# Patient Record
Sex: Female | Born: 1985 | Race: White | Hispanic: No | Marital: Single | Smoking: Current some day smoker
Health system: Southern US, Community
[De-identification: ages and names within clinical notes are randomized; demographics above are authoritative.]

## PROBLEM LIST (undated history)

## (undated) DIAGNOSIS — F32A Depression, unspecified: Secondary | ICD-10-CM

## (undated) DIAGNOSIS — A483 Toxic shock syndrome: Secondary | ICD-10-CM

## (undated) DIAGNOSIS — F329 Major depressive disorder, single episode, unspecified: Secondary | ICD-10-CM

## (undated) HISTORY — DX: Depression, unspecified: F32.A

## (undated) HISTORY — DX: Major depressive disorder, single episode, unspecified: F32.9

## (undated) HISTORY — DX: Toxic shock syndrome: A48.3

---
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
## 2005-04-01 ENCOUNTER — Emergency Department (HOSPITAL_COMMUNITY): Admission: EM | Admit: 2005-04-01 | Discharge: 2005-04-01 | Payer: Self-pay | Admitting: Emergency Medicine

## 2006-02-11 ENCOUNTER — Emergency Department (HOSPITAL_COMMUNITY): Admission: EM | Admit: 2006-02-11 | Discharge: 2006-02-12 | Payer: Self-pay | Admitting: Emergency Medicine

## 2008-06-17 ENCOUNTER — Emergency Department (HOSPITAL_COMMUNITY): Admission: EM | Admit: 2008-06-17 | Discharge: 2008-06-17 | Payer: Self-pay | Admitting: Family Medicine

## 2010-04-06 ENCOUNTER — Inpatient Hospital Stay (HOSPITAL_COMMUNITY): Admission: AD | Admit: 2010-04-06 | Discharge: 2010-04-06 | Payer: Self-pay | Admitting: Obstetrics and Gynecology

## 2010-07-19 ENCOUNTER — Inpatient Hospital Stay (HOSPITAL_COMMUNITY): Admission: AD | Admit: 2010-07-19 | Discharge: 2010-07-24 | Payer: Self-pay | Admitting: Obstetrics and Gynecology

## 2010-07-27 ENCOUNTER — Ambulatory Visit: Admission: RE | Admit: 2010-07-27 | Discharge: 2010-07-27 | Payer: Self-pay | Admitting: Obstetrics & Gynecology

## 2011-03-10 LAB — CBC
HCT: 29.5 % — ABNORMAL LOW (ref 36.0–46.0)
HCT: 33.8 % — ABNORMAL LOW (ref 36.0–46.0)
Hemoglobin: 10.3 g/dL — ABNORMAL LOW (ref 12.0–15.0)
MCHC: 34.8 g/dL (ref 30.0–36.0)
MCHC: 34.9 g/dL (ref 30.0–36.0)
MCV: 98.2 fL (ref 78.0–100.0)
Platelets: 165 10*3/uL (ref 150–400)
Platelets: 205 10*3/uL (ref 150–400)
RBC: 2.98 MIL/uL — ABNORMAL LOW (ref 3.87–5.11)
RBC: 3.45 MIL/uL — ABNORMAL LOW (ref 3.87–5.11)
RDW: 14.7 % (ref 11.5–15.5)
WBC: 12.1 10*3/uL — ABNORMAL HIGH (ref 4.0–10.5)

## 2011-03-14 LAB — URINALYSIS, ROUTINE W REFLEX MICROSCOPIC
Bilirubin Urine: NEGATIVE
Glucose, UA: NEGATIVE mg/dL
Hgb urine dipstick: NEGATIVE
Ketones, ur: NEGATIVE mg/dL
Nitrite: NEGATIVE
Protein, ur: NEGATIVE mg/dL
Urobilinogen, UA: 0.2 mg/dL (ref 0.0–1.0)

## 2011-03-14 LAB — COMPREHENSIVE METABOLIC PANEL
ALT: 19 U/L (ref 0–35)
AST: 19 U/L (ref 0–37)
Albumin: 3 g/dL — ABNORMAL LOW (ref 3.5–5.2)
Alkaline Phosphatase: 59 U/L (ref 39–117)
CO2: 20 mEq/L (ref 19–32)
Calcium: 8.6 mg/dL (ref 8.4–10.5)
Creatinine, Ser: 0.51 mg/dL (ref 0.4–1.2)
GFR calc non Af Amer: >60 mL/min (ref >60)
Glucose, Bld: 118 mg/dL — ABNORMAL HIGH (ref 70–99)
Sodium: 135 mEq/L (ref 135–145)
Total Bilirubin: 0.2 mg/dL — ABNORMAL LOW (ref 0.3–1.2)
Total Protein: 5.8 g/dL — ABNORMAL LOW (ref 6.0–8.3)

## 2011-05-25 LAB — HM PAP SMEAR: HM Pap smear: NORMAL

## 2011-09-20 LAB — CBC
Hemoglobin: 13.8
Platelets: 266
RBC: 4.23

## 2011-09-20 LAB — DIFFERENTIAL
Basophils Relative: 1
Eosinophils Relative: 1
Lymphocytes Relative: 25
Monocytes Absolute: 0.6
Monocytes Relative: 7
Neutrophils Relative %: 66

## 2011-09-20 LAB — COMPREHENSIVE METABOLIC PANEL
ALT: 17
AST: 22
BUN: 11
CO2: 25
Chloride: 106
GFR calc Af Amer: >60
GFR calc non Af Amer: >60
Glucose, Bld: 89
Sodium: 139
Total Bilirubin: 0.6

## 2012-01-08 ENCOUNTER — Ambulatory Visit (INDEPENDENT_AMBULATORY_CARE_PROVIDER_SITE_OTHER): Payer: Managed Care, Other (non HMO) | Admitting: Family

## 2012-01-08 ENCOUNTER — Encounter: Payer: Self-pay | Admitting: Family

## 2012-01-08 DIAGNOSIS — Z720 Tobacco use: Secondary | ICD-10-CM

## 2012-01-08 DIAGNOSIS — F418 Other specified anxiety disorders: Secondary | ICD-10-CM | POA: Insufficient documentation

## 2012-01-08 DIAGNOSIS — F909 Attention-deficit hyperactivity disorder, unspecified type: Secondary | ICD-10-CM

## 2012-01-08 DIAGNOSIS — M542 Cervicalgia: Secondary | ICD-10-CM | POA: Insufficient documentation

## 2012-01-08 DIAGNOSIS — F17200 Nicotine dependence, unspecified, uncomplicated: Secondary | ICD-10-CM

## 2012-01-08 DIAGNOSIS — F329 Major depressive disorder, single episode, unspecified: Secondary | ICD-10-CM

## 2012-01-08 MED ORDER — AMPHETAMINE-DEXTROAMPHET ER 15 MG PO CP24: 15.0000 mg | ORAL_CAPSULE | ORAL | Status: DC

## 2012-01-08 MED ORDER — METHYLPREDNISOLONE 4 MG PO KIT: PACK | ORAL | Status: AC

## 2012-01-08 MED ORDER — BUPROPION HCL ER (XL) 150 MG PO TB24: 150.0000 mg | ORAL_TABLET | Freq: Every day | ORAL | Status: DC

## 2012-01-08 MED ORDER — CYCLOBENZAPRINE HCL 5 MG PO TABS: ORAL_TABLET | ORAL | Status: DC

## 2012-01-08 NOTE — Assessment & Plan Note (Signed)
We discussed the importance of smoking cessation for her health.  Given the occasional nature of her cigarette use, I have recommended the nicorette gum in place of cigarettes.  She is very motivated to quit.  3 minutes spent counseling pt on smoking cessation today.

## 2012-01-08 NOTE — Progress Notes (Signed)
Subjective:    Patient ID: Joanne [name], female    DOB: Apr 19, 1986, 26 y.o.   MRN: [id number]  HPI  Joanne [name] is a 26 yr old female who presents today to establish care.  Previously she was following with a primary care in [place].  She is originally from [place].  Asthma- As a child- no longer an active issue per patient.   Depression- She reports that she has had depression since her early teens.  Reports some seasonal affective disorder.  Reports that depression runs in her family.  Had some post-partum depression following the birth of her daughter in 2011.  Has been treated with zoloft in the past which helped with some of her anxiety symptoms. Currently on Wellbutrin and reports that she feels good. She feels that she is less tearful and more level headed since starting Wellbutrin.  ADD- she reports that she was diagnosed in her teens. Reports that she had a formal psychological evaluation in her teens and again in college.   She has taken Strattera- made her feel "aggressive." She reports that ADD is well controlled on Adderall and she is requesting a refill.   Neck pain- This is her primary concern today.  She reports that she gets "Kinks" in her neck every few months.  Sunday night 1/6, she woke up with "kink" in neck.  Symptoms worsened.  She has been using ibuprofen 800 which helps some with the pain.  Pain is worst with laying down, better with activity.  Trouble sleeping due to severe pain.  She has associated numbness/pain in the left arm.    Tobacco abuse- She smokes 1 pack a week.  She tells me that she smokes "socially." Doesn't "crave" cigarettes due to the Wellbutrin.  Really wants to be healthier.    Review of Systems  Constitutional: Negative for unexpected weight change.  HENT: Positive for neck pain. Negative for congestion.   Eyes:       Wears contacts  Respiratory: Negative for cough.   Cardiovascular: Negative for chest pain.  Gastrointestinal: Negative for  nausea, vomiting and diarrhea.  Genitourinary: Negative for menstrual problem.  Musculoskeletal:       L shoulder pain  Neurological:       Occasional headaches which she attributes to neck pain.   Hematological: Negative for adenopathy.  Psychiatric/Behavioral:       See HPI       Objective:   Physical Exam  Constitutional: She is oriented to person, place, and time. She appears well-developed and well-nourished. No distress.  HENT:  Head: Normocephalic and atraumatic.  Right Ear: Tympanic membrane and ear canal normal.  Left Ear: Tympanic membrane and ear canal normal.  Mouth/Throat: No posterior oropharyngeal edema or posterior oropharyngeal erythema.  Neck: Neck supple. No thyromegaly present.  Cardiovascular: Normal rate and regular rhythm.   No murmur heard. Pulmonary/Chest: Effort normal and breath sounds normal. No respiratory distress. She has no wheezes. She has no rales. She exhibits no tenderness.  Lymphadenopathy:    She has no cervical adenopathy.  Neurological: She is alert and oriented to person, place, and time.       Very slight LUE weakness noted- but I suspect that it is due to lack of effort in setting of pain.  Hand grasps are strong and equal.   Skin: Skin is warm, dry and intact.  Psychiatric: She has a normal mood and affect. Her behavior is normal. Judgment and thought content normal. Cognition and memory  are normal.          Assessment & Plan:

## 2012-01-08 NOTE — Assessment & Plan Note (Signed)
Clinically stable on Wellbutrin.  Continue same.

## 2012-01-08 NOTE — Patient Instructions (Signed)
Please arrange a fasting physical. Welcome to [facility]!

## 2012-01-08 NOTE — Assessment & Plan Note (Addendum)
New. Trial of Medrol Dose Pak + flexeril HS PRN.  If no improvement with these measures consider MRI of the C spine.

## 2012-01-08 NOTE — Assessment & Plan Note (Addendum)
Stable. Reviewed [place] Controlled substance reg and see that she had 1 prescription filled in August of this year [place] for the stated dose. A controlled substance contract was signed today.

## 2012-01-24 ENCOUNTER — Telehealth: Payer: Self-pay | Admitting: *Deleted

## 2012-01-24 NOTE — Telephone Encounter (Signed)
Records received and forwarded to Provider for review. 

## 2012-02-12 ENCOUNTER — Encounter: Payer: Managed Care, Other (non HMO) | Admitting: Family

## 2012-02-18 ENCOUNTER — Telehealth: Payer: Self-pay | Admitting: *Deleted

## 2012-02-18 NOTE — Telephone Encounter (Signed)
Received message from pt stating she wants to taper off of Wellbutrin and is not sure how to proceed. Currently takes 150mg  daily. Please advise.

## 2012-02-19 MED ORDER — BUPROPION HCL 75 MG PO TABS: ORAL_TABLET | ORAL | Status: DC

## 2012-02-19 NOTE — Telephone Encounter (Signed)
Notified pt and scheduled f/u for 04/01/12 at 8:15am.

## 2012-02-19 NOTE — Telephone Encounter (Signed)
See instructions below for Wellbutrin 75mg  which has been sent to her pharmacy.  She should call if she develops recurrent depression with taper.  I would like to see her back in the office in 6 weeks please.

## 2012-04-01 ENCOUNTER — Ambulatory Visit: Payer: Managed Care, Other (non HMO) | Admitting: Family

## 2012-04-08 ENCOUNTER — Ambulatory Visit: Payer: Managed Care, Other (non HMO) | Admitting: Family

## 2012-05-16 ENCOUNTER — Telehealth: Payer: Self-pay | Admitting: *Deleted

## 2012-05-16 NOTE — Telephone Encounter (Signed)
Intermittent spotting can be normal with mirena, but I do think she should complete home hcg test.

## 2012-05-16 NOTE — Telephone Encounter (Signed)
Received message from pt that she has had IUD (mirena) x 1 1/2 yrs. Has had spotting x 2 weeks with nausea. States she is going to do a home HCG test but wants to know if this is normal?  Please advise.

## 2012-05-16 NOTE — Telephone Encounter (Signed)
Left detailed message on pt's voicemail and to call Tuesday if further concerns.

## 2012-07-28 ENCOUNTER — Ambulatory Visit (INDEPENDENT_AMBULATORY_CARE_PROVIDER_SITE_OTHER): Payer: Managed Care, Other (non HMO) | Admitting: Family

## 2012-07-28 ENCOUNTER — Encounter: Payer: Self-pay | Admitting: Family

## 2012-07-28 VITALS — BP 100/70 | HR 84 | Temp 97.9°F | Resp 16 | Ht 65.5 in | Wt 205.0 lb

## 2012-07-28 DIAGNOSIS — J069 Acute upper respiratory infection, unspecified: Secondary | ICD-10-CM

## 2012-07-28 DIAGNOSIS — F909 Attention-deficit hyperactivity disorder, unspecified type: Secondary | ICD-10-CM

## 2012-07-28 MED ORDER — AMPHETAMINE-DEXTROAMPHETAMINE 10 MG PO TABS: 10.0000 mg | ORAL_TABLET | Freq: Every day | ORAL | Status: DC

## 2012-07-28 MED ORDER — DOXYCYCLINE HYCLATE 100 MG PO CAPS: 100.0000 mg | ORAL_CAPSULE | Freq: Two times a day (BID) | ORAL | Status: AC

## 2012-07-28 MED ORDER — FLUTICASONE PROPIONATE 50 MCG/ACT NA SUSP: 2.0000 | Freq: Every day | NASAL | Status: DC

## 2012-07-28 NOTE — Assessment & Plan Note (Signed)
Symptoms most consistent with URI at this point.  Add flonase, zyrtec D. Rx provided to pt for doxycycline if her symptoms worsen, or if no improvement in 3 days with these recs.

## 2012-07-28 NOTE — Assessment & Plan Note (Signed)
Deteriorated.  She has been intolerant to strattera in the past.  Will add regular release adderall in the AM to see if this will hold her through the majority of her work day without causing insomnia.  Will re-evaluate anxiety at her upcoming visit.  Consider adding zoloft at that time if no improvement. 25 minutes spent with pt today. >50% of this time was spent counseling pt on her ADD, anxiety, depression.

## 2012-07-28 NOTE — Progress Notes (Signed)
Subjective:    Patient ID: Joanne [name], female    DOB: 1986/06/15, 26 y.o.   MRN: [id number]  HPI  [name] is a 26 yr old female who presents today to discuss sinus congestion and for follow up.  1) Sinus congestion- Reports that Friday 8/2, she developed facial pain, body aches, nausea.  She has been using an otc allergy prep without improvement.  No fever.   2) Adderall- Went off of adderall and wellbutrin in February.  Worried about long term side effects of the medication.  Job performance has dropped. Hasn't met any of her performance markers since she stopped the adderall. She has upped b12 vitamins.  Exercising more. Felt jittery on adderal, insomnia, HA.  Depression- she reports that she does not feel depressed. Thinks this is more of a seasonal issue.    Anxiety- reports impulsive thoughts.  Review of Systems See HPI  Past Medical History  Diagnosis Date  . Asthma     childhood exercise induced  . Depression   . 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 Marital Status: Single    Spouse Name: N/A    Number of Children: 2  . Years of Education: N/A   Occupational History  . Not on file.   Social History Main Topics  . Smoking status: Current Everyday Smoker    Types: Cigarettes  . Smokeless tobacco: Never Used   Comment: <1 pack cigarettes weekly.  . Alcohol Use: 0.5 - 2.0 oz/week    1-4 drink(s) per week  . Drug Use: No  . Sexually Active: Not on file   Other Topics Concern  . Not on file   Social History Narrative   Caffeine use: 1 cup coffee dailyRegular exercise: no Daughter born in 2011.Works at [employer] with daughter and her boyfriend- they are in process of separating.     Past Surgical History  Procedure Date  . Cesarean section 2011  . Tonsillectomy 2004    Family History  Problem Relation Age of Onset  . Hypertension Father   . Bipolar disorder Maternal Uncle   .  Arthritis Maternal Grandmother   . Cancer Maternal Grandmother     ovarian  . Depression Maternal Grandmother   . Bipolar disorder Maternal Grandmother   . Arthritis Maternal Grandfather   . Heart disease Maternal Grandfather   . Stroke Maternal Grandfather   . Hypertension Maternal Grandfather   . Arthritis Paternal Grandmother   . Cancer Paternal Grandmother   . Arthritis Paternal Grandfather   . Stroke Paternal Grandfather   . Hypertension Paternal Grandfather   . Cancer Other     breast    Allergies  Allergen Reactions  . Amoxicillin Hives  . Penicillins Hives  . Thera-Gesic Rash    Current Outpatient Prescriptions on File Prior to Visit  Medication Sig Dispense Refill  . cyclobenzaprine (FLEXERIL) 5 MG tablet One tablet at bedtime as needed for neck pain  15 tablet  0  . levonorgestrel (MIRENA) 20 MCG/24HR IUD 1 each by Intrauterine route once.      Marland Kitchen buPROPion (WELLBUTRIN) 75 MG tablet One half tablet twice daily for 2 weeks, then every other day for 2 weeks then stop.  21 tablet  0  . fluticasone (FLONASE) 50 MCG/ACT nasal spray Place 2 sprays into the nose daily.  16 g  0  . DISCONTD: amphetamine-dextroamphetamine (ADDERALL XR) 15 MG  24 hr capsule Take 1 capsule (15 mg total) by mouth every morning.  30 capsule  0    BP 100/70  Pulse 84  Temp 97.9 F (36.6 C) (Oral)  Resp 16  Ht 5' 5.5" (1.664 m)  Wt 205 lb 0.6 oz (93.006 kg)  BMI 33.60 kg/m2  SpO2 99%       Objective:   Physical Exam  Constitutional: She appears well-developed and well-nourished. No distress.  HENT:  Head: Normocephalic and atraumatic.  Right Ear: Tympanic membrane and ear canal normal.  Left Ear: Tympanic membrane and ear canal normal.       No maxillary or frontal sinus tenderness to palpation.  Cardiovascular: Normal rate and regular rhythm.   No murmur heard. Pulmonary/Chest: Effort normal and breath sounds normal. No respiratory distress. She has no wheezes. She has no rales. She  exhibits no tenderness.  Psychiatric: She has a normal mood and affect. Her behavior is normal. Judgment and thought content normal.          Assessment & Plan:

## 2012-07-28 NOTE — Patient Instructions (Addendum)
Please start flonase and zytec D. If sinus symptoms worsen, if you develop fever or if not improved in 3 days- then start doxycyline. Follow up in 6 weeks.

## 2012-09-02 ENCOUNTER — Telehealth: Payer: Self-pay | Admitting: Family

## 2012-09-02 MED ORDER — AMPHETAMINE-DEXTROAMPHETAMINE 10 MG PO TABS: 10.0000 mg | ORAL_TABLET | Freq: Every day | ORAL | Status: DC

## 2012-09-02 NOTE — Telephone Encounter (Signed)
Patient is requesting a new prescription of adderall °

## 2012-09-02 NOTE — Telephone Encounter (Signed)
Adderall rx last printed on 07/28/12. Pt has follow up on 09/08/12. Rx printed and forwarded to Provider for signature.

## 2012-09-02 NOTE — Telephone Encounter (Signed)
Notified pt. 

## 2012-09-02 NOTE — Telephone Encounter (Signed)
Signed.

## 2012-09-08 ENCOUNTER — Ambulatory Visit: Payer: Managed Care, Other (non HMO) | Admitting: Family

## 2012-09-10 ENCOUNTER — Encounter: Payer: Self-pay | Admitting: Family

## 2012-09-10 ENCOUNTER — Ambulatory Visit (INDEPENDENT_AMBULATORY_CARE_PROVIDER_SITE_OTHER): Payer: Managed Care, Other (non HMO) | Admitting: Family

## 2012-09-10 VITALS — BP 134/86 | HR 92 | Temp 97.8°F | Resp 16 | Wt 203.1 lb

## 2012-09-10 DIAGNOSIS — F329 Major depressive disorder, single episode, unspecified: Secondary | ICD-10-CM

## 2012-09-10 DIAGNOSIS — F909 Attention-deficit hyperactivity disorder, unspecified type: Secondary | ICD-10-CM

## 2012-09-10 MED ORDER — AMPHETAMINE-DEXTROAMPHETAMINE 10 MG PO TABS: 10.0000 mg | ORAL_TABLET | Freq: Every day | ORAL | Status: DC

## 2012-09-10 NOTE — Patient Instructions (Addendum)
Please schedule a follow up appointment in 3 months.

## 2012-09-10 NOTE — Assessment & Plan Note (Signed)
Stable.  Continue Adderall at current dose.

## 2012-09-10 NOTE — Progress Notes (Signed)
  Subjective:    Patient ID: Joanne [name], female    DOB: 01/21/1986, 26 y.o.   MRN: [id number]  HPI  [name] presents today for follow up.   ADHD- Pt reports that she is feeling great on current adderall regimen.  No longer feeling jittery, able to sleep at night.  Notes concentration is very good during the day. Concentration wanes around 3:30-4pm. She is ok with that.   Anxiety-  Mild at this point.  Denies depression.   Review of Systems See HPI  Past Medical History  Diagnosis Date  . Asthma     childhood exercise induced  . Depression   . 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 Marital Status: Single    Spouse Name: N/A    Number of Children: 2  . Years of Education: N/A   Occupational History  . Not on file.   Social History Main Topics  . Smoking status: Current Every Day Smoker    Types: Cigarettes  . Smokeless tobacco: Never Used   Comment: <1 pack cigarettes weekly.  . Alcohol Use: 0.5 - 2.0 oz/week    1-4 drink(s) per week  . Drug Use: No  . Sexually Active: Not on file   Other Topics Concern  . Not on file   Social History Narrative   Caffeine use: 1 cup coffee dailyRegular exercise: no Daughter born in 2011.Works at [employer] with daughter and her boyfriend- they are in process of separating.     Past Surgical History  Procedure Date  . Cesarean section 2011  . Tonsillectomy 2004    Family History  Problem Relation Age of Onset  . Hypertension Father   . Bipolar disorder Maternal Uncle   . Arthritis Maternal Grandmother   . Cancer Maternal Grandmother     ovarian  . Depression Maternal Grandmother   . Bipolar disorder Maternal Grandmother   . Arthritis Maternal Grandfather   . Heart disease Maternal Grandfather   . Stroke Maternal Grandfather   . Hypertension Maternal Grandfather   . Arthritis Paternal Grandmother   . Cancer Paternal Grandmother   . Arthritis  Paternal Grandfather   . Stroke Paternal Grandfather   . Hypertension Paternal Grandfather   . Cancer Other     breast    Allergies  Allergen Reactions  . Amoxicillin Hives  . Penicillins Hives  . Thera-Gesic Rash    Current Outpatient Prescriptions on File Prior to Visit  Medication Sig Dispense Refill  . DISCONTD: amphetamine-dextroamphetamine (ADDERALL) 10 MG tablet Take 1 tablet (10 mg total) by mouth daily.  30 tablet  0  . levonorgestrel (MIRENA) 20 MCG/24HR IUD 1 each by Intrauterine route once.        BP 134/86  Pulse 92  Temp 97.8 F (36.6 C) (Oral)  Resp 16  Wt 203 lb 1.3 oz (92.116 kg)  SpO2 99%       Objective:   Physical Exam  Constitutional: She appears well-developed and well-nourished. No distress.  Pulmonary/Chest: Effort normal and breath sounds normal.  Musculoskeletal: She exhibits edema.  Psychiatric: She has a normal mood and affect. Her behavior is normal. Judgment and thought content normal.          Assessment & Plan:

## 2012-09-10 NOTE — Assessment & Plan Note (Signed)
Depression/anxiety are stable. She is off of the Wellbutrin.

## 2012-09-15 ENCOUNTER — Emergency Department (HOSPITAL_COMMUNITY)
Admission: EM | Admit: 2012-09-15 | Discharge: 2012-09-16 | Disposition: A | Discharge status: Home / Self Care | Payer: Managed Care, Other (non HMO)

## 2012-09-15 NOTE — ED Notes (Signed)
Pt. Presented to check-in window stating that she was going to leave before being seen in triage.

## 2012-09-16 ENCOUNTER — Telehealth: Payer: Self-pay | Admitting: *Deleted

## 2012-09-16 ENCOUNTER — Encounter: Payer: Self-pay | Admitting: Family

## 2012-09-16 ENCOUNTER — Ambulatory Visit (INDEPENDENT_AMBULATORY_CARE_PROVIDER_SITE_OTHER): Payer: Managed Care, Other (non HMO) | Admitting: Family

## 2012-09-16 VITALS — BP 116/64 | HR 85 | Temp 98.5°F | Resp 16 | Wt 204.0 lb

## 2012-09-16 DIAGNOSIS — M542 Cervicalgia: Secondary | ICD-10-CM

## 2012-09-16 DIAGNOSIS — K625 Hemorrhage of anus and rectum: Secondary | ICD-10-CM

## 2012-09-16 DIAGNOSIS — R195 Other fecal abnormalities: Secondary | ICD-10-CM

## 2012-09-16 LAB — CBC WITH DIFFERENTIAL/PLATELET
Basophils Relative: 0 % (ref 0–1)
Eosinophils Absolute: 0.2 10*3/uL (ref 0.0–0.7)
Hemoglobin: 14.1 g/dL (ref 12.0–15.0)
Lymphs Abs: 2.4 10*3/uL (ref 0.7–4.0)
MCH: 32.3 pg (ref 26.0–34.0)
MCV: 95.7 fL (ref 78.0–100.0)
Monocytes Absolute: 0.4 10*3/uL (ref 0.1–1.0)
Platelets: 280 10*3/uL (ref 150–400)

## 2012-09-16 MED ORDER — CYCLOBENZAPRINE HCL 5 MG PO TABS: 5.0000 mg | ORAL_TABLET | Freq: Three times a day (TID) | ORAL | Status: DC | PRN

## 2012-09-16 NOTE — Patient Instructions (Addendum)
Call if you experience red colored stools after today.

## 2012-09-16 NOTE — Assessment & Plan Note (Signed)
Recommended trial of ibuprofen and prn flexeril- pt was reminded not to drive after taking flexeril due to drowsiness.

## 2012-09-16 NOTE — Assessment & Plan Note (Signed)
Pt is heme negative, rectal and abdominal exam wnl.  I suspect that red stool was result of food dye from red velvet cake earlier that day. Will obtain cbc.  Pt is advised to call us if she sees any further red stools.  Pt verbalizes understanding.

## 2012-09-16 NOTE — Progress Notes (Signed)
Subjective:    Patient ID: Joanne [name], female    DOB: 11/21/1986, 26 y.o.   MRN: [id number]  HPI  Ms.  Joanne [name] is a 26 yr old female who presents today with chief complain of red stool.  Pt reports episode on Monday evening of bright red stool and blood in the toilet. Reports feeling fatigued, slightly dizzy and has noted abdominal cramping since the weekend. No BM today. Pt ate red velvet cake yesterday AM.  Reports "pinched nerve" in the right shoulder.  She has not used ibuprofen.  Reports that she recently moved and was doing some lifting.  Has flare ups about 3 times a year.    Review of Systems    see HPI  Past Medical History  Diagnosis Date  . Asthma     childhood exercise induced  . Depression   . 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 Marital Status: Single    Spouse Name: N/A    Number of Children: 2  . Years of Education: N/A   Occupational History  . Not on file.   Social History Main Topics  . Smoking status: Current Every Day Smoker    Types: Cigarettes  . Smokeless tobacco: Never Used   Comment: <1 pack cigarettes weekly.  . Alcohol Use: 0.5 - 2.0 oz/week    1-4 drink(s) per week  . Drug Use: No  . Sexually Active: Not on file   Other Topics Concern  . Not on file   Social History Narrative   Caffeine use: 1 cup coffee dailyRegular exercise: no Daughter born in 2011.Works at [employer] with daughter and her boyfriend- they are in process of separating.     Past Surgical History  Procedure Date  . Cesarean section 2011  . Tonsillectomy 2004    Family History  Problem Relation Age of Onset  . Hypertension Father   . Bipolar disorder Maternal Uncle   . Arthritis Maternal Grandmother   . Cancer Maternal Grandmother     ovarian  . Depression Maternal Grandmother   . Bipolar disorder Maternal Grandmother   . Arthritis Maternal Grandfather   . Heart disease Maternal  Grandfather   . Stroke Maternal Grandfather   . Hypertension Maternal Grandfather   . Arthritis Paternal Grandmother   . Cancer Paternal Grandmother   . Arthritis Paternal Grandfather   . Stroke Paternal Grandfather   . Hypertension Paternal Grandfather   . Cancer Other     breast    Allergies  Allergen Reactions  . Amoxicillin Hives  . Penicillins Hives  . Thera-Gesic Rash    Current Outpatient Prescriptions on File Prior to Visit  Medication Sig Dispense Refill  . amphetamine-dextroamphetamine (ADDERALL) 10 MG tablet Take 1 tablet (10 mg total) by mouth daily.  30 tablet  0  . levonorgestrel (MIRENA) 20 MCG/24HR IUD 1 each by Intrauterine route once.        BP 116/64  Pulse 85  Temp 98.5 F (36.9 C) (Oral)  Resp 16  Wt 204 lb (92.534 kg)  SpO2 99%    Objective:   Physical Exam  Constitutional: She is oriented to person, place, and time. She appears well-developed and well-nourished. No distress.  Cardiovascular: Normal rate and regular rhythm.   No murmur heard. Pulmonary/Chest: Effort normal and breath sounds normal. No respiratory distress. She has no wheezes. She has no rales. She exhibits no tenderness.  Abdominal: Soft. Bowel sounds are normal.  Genitourinary: Guaiac negative stool.       Rectal exam normal.  Neurological: She is alert and oriented to person, place, and time.  Skin: Skin is warm and dry.  Psychiatric: She has a normal mood and affect. Her behavior is normal. Judgment and thought content normal.          Assessment & Plan:

## 2012-09-16 NOTE — Telephone Encounter (Signed)
Pt has appt with Sandford Craze today at 9:30. Attempted to reach pt to have her come in earlier per Provider recommendation and left detailed message on voicemail.  [facility] Triage Call Report Triage Record Num: [id number] Operator: Tarri Glenn Patient Name: Joanne [name] Call Date & Time: 09/15/2012 6:13:34PM Patient Phone: [phone] PCP: Sandford Craze, NP Patient Gender: Female PCP Fax : [phone] Patient DOB: [birth year] Practice Name: [facility] Reason for Call: Caller: Bobbijo/Patient; PCP: Peggyann Juba, Efraim Kaufmann (Adults only); CB#: [phone]; Patient is calling about patient had bowel movement 09/15/12 and stool was extremely bloody even turning toilet water red. Afebrile. All emergent signs and symptoms ruled out with exception to one or more episodes of rectal bleeding and no symptoms of hypovolemia per Gastrointestinal Bleeding protocol. Advised patient to go to emergency room per see provider within 4 hours disposition. Patient will go to [facility]. Protocol(s) Used: Gastrointestinal Bleeding Recommended Outcome per Protocol: See Provider within 4 hours Reason for Outcome: One or more episodes of rectal bleeding (more than scant) and no symptoms of hypovolemia Care Advice: May have clear liquids (such as water, clear fruit juices without pulp, soda, tea or coffee without dairy or non-dairy creamer, clear broth or bouillon, oral hydration solution, or plain gelatin, fruit ices/popsicles, hard candy) but do not eat solid foods before being seen by your provider.

## 2012-09-17 ENCOUNTER — Encounter: Payer: Self-pay | Admitting: Family

## 2012-11-04 ENCOUNTER — Other Ambulatory Visit: Payer: Self-pay | Admitting: *Deleted

## 2012-11-04 MED ORDER — AMPHETAMINE-DEXTROAMPHETAMINE 10 MG PO TABS: 10.0000 mg | ORAL_TABLET | Freq: Every day | ORAL | Status: DC

## 2012-11-04 NOTE — Telephone Encounter (Signed)
Signed.

## 2012-11-04 NOTE — Telephone Encounter (Signed)
Rx placed at front desk and pt notified. 

## 2012-11-04 NOTE — Telephone Encounter (Signed)
Pt left message requesting refill of Adderall. Rx last printed on 09/16/12 #30 x no refills. Rx printed and forwarded to Provider for signature.

## 2012-12-08 ENCOUNTER — Ambulatory Visit: Payer: Managed Care, Other (non HMO) | Admitting: Family

## 2012-12-11 ENCOUNTER — Telehealth: Payer: Self-pay | Admitting: *Deleted

## 2012-12-11 MED ORDER — AMPHETAMINE-DEXTROAMPHETAMINE 10 MG PO TABS: 10.0000 mg | ORAL_TABLET | Freq: Every day | ORAL | Status: DC

## 2012-12-11 NOTE — Telephone Encounter (Signed)
Received message from pt requesting refill of Adderall 10mg . Rx last printed on 11/04/12. Rx printed for #30 x no refills and forwarded to Provider for signature. Also, pt cancelled appointment on 12/08/12.

## 2012-12-12 NOTE — Telephone Encounter (Signed)
Message left informing pt that Rx is ready for pick up. Note attached to rx to schedule f/u at the end of March.

## 2012-12-12 NOTE — Telephone Encounter (Signed)
Signed. I would like to see her back in the end of march please.

## 2013-01-06 ENCOUNTER — Encounter: Payer: Self-pay | Admitting: Family

## 2013-01-06 ENCOUNTER — Ambulatory Visit (INDEPENDENT_AMBULATORY_CARE_PROVIDER_SITE_OTHER): Payer: Managed Care, Other (non HMO) | Admitting: Family

## 2013-01-06 VITALS — BP 110/70 | HR 98 | Temp 98.7°F | Resp 16 | Wt 208.0 lb

## 2013-01-06 DIAGNOSIS — J988 Other specified respiratory disorders: Secondary | ICD-10-CM | POA: Insufficient documentation

## 2013-01-06 DIAGNOSIS — B9789 Other viral agents as the cause of diseases classified elsewhere: Secondary | ICD-10-CM | POA: Insufficient documentation

## 2013-01-06 MED ORDER — OSELTAMIVIR PHOSPHATE 75 MG PO CAPS: 75.0000 mg | ORAL_CAPSULE | Freq: Two times a day (BID) | ORAL | Status: DC

## 2013-01-06 NOTE — Addendum Note (Signed)
Addended by: Mervin Kung A on: 01/06/2013 02:37 PM   Modules accepted: Orders

## 2013-01-06 NOTE — Assessment & Plan Note (Signed)
Rapid flu is negative.  Daughter has confirmed flu.  Will go ahead and treat with tamiflu.  Recommended rest, fluids, motrin prn, call if symptoms worsen or do not resolve in 1 week.

## 2013-01-06 NOTE — Patient Instructions (Addendum)
Call if symptoms worsen or if not improved in 1 week.

## 2013-01-06 NOTE — Progress Notes (Signed)
Subjective:    Patient ID: Joanne [name], female    DOB: 02/23/1986, 27 y.o.   MRN: [id number]  HPI  Joanne [name] is a 27 yr old female who presents today with chief complaint of cough. She reports associated body aches, myalgia, chills, nasal congestion, sore throat.    Joanne [name] was diagnosed with flu.  Symptoms started yesterday.  Worsened this AM.   Review of Systems See HPI Past Medical History  Diagnosis Date  . Asthma     childhood exercise induced  . Depression   . Toxic shock syndrome (TSS)     27 yrs old    History   Social History  . Marital Status: Single    Spouse Name: N/A    Number of Children: 2  . Years of Education: N/A   Occupational History  . Not on file.   Social History Main Topics  . Smoking status: Current Every Day Smoker    Types: Cigarettes  . Smokeless tobacco: Never Used     Comment: <1 pack cigarettes weekly.  . Alcohol Use: 0.5 - 2.0 oz/week    1-4 drink(s) per week  . Drug Use: No  . Sexually Active: Not on file   Other Topics Concern  . Not on file   Social History Narrative   Caffeine use: 1 cup coffee dailyRegular exercise: no Joanne [name] born in 2011.Works at [employer] with Joanne [name] and her boyfriend- they are in process of separating.     Past Surgical History  Procedure Date  . Cesarean section 2011  . Tonsillectomy 2004    Family History  Problem Relation Age of Onset  . Hypertension Father   . Bipolar disorder Maternal Uncle   . Arthritis Maternal Grandmother   . Cancer Maternal Grandmother     ovarian  . Depression Maternal Grandmother   . Bipolar disorder Maternal Grandmother   . Arthritis Maternal Grandfather   . Heart disease Maternal Grandfather   . Stroke Maternal Grandfather   . Hypertension Maternal Grandfather   . Arthritis Paternal Grandmother   . Cancer Paternal Grandmother   . Arthritis Paternal Grandfather   . Stroke Paternal Grandfather   . Hypertension Paternal  Grandfather   . Cancer Other     breast    Allergies  Allergen Reactions  . Amoxicillin Hives  . Penicillins Hives  . Thera-Gesic Rash    Current Outpatient Prescriptions on File Prior to Visit  Medication Sig Dispense Refill  . amphetamine-dextroamphetamine (ADDERALL) 10 MG tablet Take 1 tablet (10 mg total) by mouth daily.  30 tablet  0  . levonorgestrel (MIRENA) 20 MCG/24HR IUD 1 each by Intrauterine route once.      . cyclobenzaprine (FLEXERIL) 5 MG tablet Take 1 tablet (5 mg total) by mouth 3 (three) times daily as needed for muscle spasms.  20 tablet  0    BP 110/70  Pulse 98  Temp 98.7 F (37.1 C) (Oral)  Resp 16  Wt 208 lb 0.6 oz (94.366 kg)  SpO2 99%       Objective:   Physical Exam  Constitutional: She is oriented to person, place, and time. She appears well-developed and well-nourished. No distress.       Tired appearing female.   HENT:  Head: Normocephalic and atraumatic.  Right Ear: Tympanic membrane and ear canal normal.  Left Ear: Tympanic membrane and ear canal normal.  Mouth/Throat: No posterior oropharyngeal edema or posterior oropharyngeal erythema.  Cardiovascular: Normal rate and regular rhythm.  No murmur heard. Pulmonary/Chest: Effort normal and breath sounds normal. No respiratory distress. She has no wheezes. She has no rales. She exhibits no tenderness.  Musculoskeletal: She exhibits no edema.  Lymphadenopathy:    She has no cervical adenopathy.  Neurological: She is alert and oriented to person, place, and time.  Skin: Skin is warm and dry.  Psychiatric: She has a normal mood and affect. Her behavior is normal. Judgment and thought content normal.          Assessment & Plan:

## 2013-01-13 ENCOUNTER — Telehealth: Payer: Self-pay | Admitting: *Deleted

## 2013-01-13 MED ORDER — AMPHETAMINE-DEXTROAMPHETAMINE 10 MG PO TABS: 10.0000 mg | ORAL_TABLET | Freq: Every day | ORAL | Status: DC

## 2013-01-13 NOTE — Telephone Encounter (Signed)
Pt called to f/u with you RE: Flu; pt reports that she feels "about 80% better, still has fatigue & a little cough".  Pt request refill for Adderall 10 mg [last Rx 12.19.13 #30x0]/SLS Please advise.

## 2013-01-13 NOTE — Telephone Encounter (Signed)
OK to refill adderall- printed.  Viral illness can last up to 10 days. I would expect her to be nearly 100% by the 24th.  Call me if symptoms do not continue to improve.

## 2013-01-13 NOTE — Telephone Encounter (Signed)
LMOM with contact name & number to inform pt of provider instructions & that requested Rx is ready for p/u Mon-Fri 8a-5p/SLS

## 2013-01-28 ENCOUNTER — Telehealth: Payer: Self-pay | Admitting: Family

## 2013-01-28 MED ORDER — AMPHETAMINE-DEXTROAMPHET ER 20 MG PO CP24: 20.0000 mg | ORAL_CAPSULE | ORAL | Status: DC

## 2013-01-28 NOTE — Telephone Encounter (Signed)
Please advise 

## 2013-01-28 NOTE — Telephone Encounter (Signed)
Left detailed message on cell voicemail and to call if any questions. Rx placed at front desk for pick up.

## 2013-01-28 NOTE — Telephone Encounter (Signed)
Patient left message on nurse's voicemail stating that she feels like she does better when taking adderall at breakfast and at lunch instead of taking one daily. She would like to know if Melissa could write her a new Rx for this?

## 2013-01-28 NOTE — Telephone Encounter (Signed)
I would recommend trial of Adderall extended release 20mg  once daily. Rx available for pick up.  Follow up in 1 month.

## 2013-02-18 ENCOUNTER — Encounter: Payer: Self-pay | Admitting: Family

## 2013-02-18 ENCOUNTER — Ambulatory Visit (HOSPITAL_BASED_OUTPATIENT_CLINIC_OR_DEPARTMENT_OTHER)
Admission: RE | Admit: 2013-02-18 | Discharge: 2013-02-18 | Disposition: A | Discharge status: Home / Self Care | Payer: Managed Care, Other (non HMO) | Source: Ambulatory Visit | Attending: Family | Admitting: Family

## 2013-02-18 ENCOUNTER — Telehealth: Payer: Self-pay | Admitting: Family

## 2013-02-18 ENCOUNTER — Ambulatory Visit (INDEPENDENT_AMBULATORY_CARE_PROVIDER_SITE_OTHER): Payer: Managed Care, Other (non HMO) | Admitting: Family

## 2013-02-18 ENCOUNTER — Other Ambulatory Visit: Payer: Self-pay | Admitting: Family

## 2013-02-18 ENCOUNTER — Other Ambulatory Visit (HOSPITAL_BASED_OUTPATIENT_CLINIC_OR_DEPARTMENT_OTHER): Payer: Managed Care, Other (non HMO)

## 2013-02-18 VITALS — BP 110/62 | HR 78 | Temp 98.0°F | Resp 16 | Wt 204.1 lb

## 2013-02-18 DIAGNOSIS — N949 Unspecified condition associated with female genital organs and menstrual cycle: Secondary | ICD-10-CM | POA: Insufficient documentation

## 2013-02-18 DIAGNOSIS — Z0189 Encounter for other specified special examinations: Secondary | ICD-10-CM

## 2013-02-18 DIAGNOSIS — N390 Urinary tract infection, site not specified: Secondary | ICD-10-CM

## 2013-02-18 DIAGNOSIS — R1032 Left lower quadrant pain: Secondary | ICD-10-CM

## 2013-02-18 LAB — POCT URINALYSIS DIPSTICK
Blood, UA: NEGATIVE
Glucose, UA: NEGATIVE
Ketones, UA: NEGATIVE
Spec Grav, UA: 1.02
Urobilinogen, UA: 0.2

## 2013-02-18 MED ORDER — CIPROFLOXACIN HCL 500 MG PO TABS: 500.0000 mg | ORAL_TABLET | Freq: Two times a day (BID) | ORAL | Status: DC

## 2013-02-18 NOTE — Assessment & Plan Note (Signed)
Urine notes + leuks.  Will rx empirically with cipro and send urine for culture.

## 2013-02-18 NOTE — Assessment & Plan Note (Signed)
Suspect cyst, will obtain pelvic ultrasound today.

## 2013-02-18 NOTE — Patient Instructions (Signed)
Please complete your pelvic ultrasound on the first floor.   Start cipro. Go to ER if abdominal pain worsens.

## 2013-02-18 NOTE — Assessment & Plan Note (Addendum)
Suspect ovarian cyst.  Will obtain pelvic ultrasound today to further evaluate.  She is instructed to go to the ER if pain worsens.  Urine HCG is negative.

## 2013-02-18 NOTE — [facility] Notes (Signed)
Subjective:    Patient ID: Joanne [name], female    DOB: 28-Feb-1986, 27 y.o.   MRN: [id number]  HPI  [name] is a 27 yr old female who presents today with chief complaint of abdominal pain.  Reports pain woke her up in the middle of the night.   Pain continues. She has a [employer] fast streamer.  She reports that about 2 weeks ago she had some spotting.   Review of Systems See HPI  Past Medical History  Diagnosis Date  . Asthma     childhood exercise induced  . Depression   . Toxic shock syndrome (TSS)     27 yrs old    History   Social History  . Marital Status: Single    Spouse Name: N/A    Number of Children: 2  . Years of Education: N/A   Occupational History  . Not on file.   Social History Main Topics  . Smoking status: Current Every Day Smoker    Types: Cigarettes  . Smokeless tobacco: Never Used     Comment: <1 pack cigarettes weekly.  . Alcohol Use: .5 - 2 oz/week    1-4 drink(s) per week  . Drug Use: No  . Sexually Active: Not on file   Other Topics Concern  . Not on file   Social History Narrative   Caffeine use: 1 cup coffee daily   Regular exercise: no    Daughter born in 2011.   Works at [employer] degree   Lives with daughter and her boyfriend- they are in process of separating.              Past Surgical History  Procedure Laterality Date  . Cesarean section  2011  . Tonsillectomy  2004    Family History  Problem Relation Age of Onset  . Hypertension Father   . Bipolar disorder Maternal Uncle   . Arthritis Maternal Grandmother   . Cancer Maternal Grandmother     ovarian  . Depression Maternal Grandmother   . Bipolar disorder Maternal Grandmother   . Arthritis Maternal Grandfather   . Heart disease Maternal Grandfather   . Stroke Maternal Grandfather   . Hypertension Maternal Grandfather   . Arthritis Paternal Grandmother   . Cancer Paternal Grandmother   . Arthritis Paternal Grandfather   . Stroke Paternal  Grandfather   . Hypertension Paternal Grandfather   . Cancer Other     breast    Allergies  Allergen Reactions  . Amoxicillin Hives  . Azithromycin Other (See Comments)    dizziness  . Penicillins Hives  . Thera-Gesic Rash    Current Outpatient Prescriptions on File Prior to Visit  Medication Sig Dispense Refill  . amphetamine-dextroamphetamine (ADDERALL XR) 20 MG 24 hr capsule Take 1 capsule (20 mg total) by mouth every morning.  30 capsule  0  . levonorgestrel (MIRENA) 20 MCG/24HR IUD 1 each by Intrauterine route once.      . cyclobenzaprine (FLEXERIL) 5 MG tablet Take 1 tablet (5 mg total) by mouth 3 (three) times daily as needed for muscle spasms.  20 tablet  0  . oseltamivir (TAMIFLU) 75 MG capsule Take 1 capsule (75 mg total) by mouth 2 (two) times daily.  10 capsule  0   No current facility-administered medications on file prior to visit.    BP 110/62  Pulse 78  Temp(Src) 98 F (36.7 C) (Oral)  Resp 16  Wt 204 lb 1.3  oz (92.57 kg)  BMI 33.43 kg/m2  SpO2 99%       Objective:   Physical Exam  Constitutional: She is oriented to person, place, and time. She appears well-developed and well-nourished. No distress.  HENT:  Head: Normocephalic and atraumatic.  Cardiovascular: Normal rate and regular rhythm.   No murmur heard. Pulmonary/Chest: Effort normal and breath sounds normal. No respiratory distress. She has no wheezes. She has no rales. She exhibits no tenderness.  Abdominal: Soft.  Lymphadenopathy:    She has no cervical adenopathy.  Neurological: She is alert and oriented to person, place, and time.  Skin: Skin is warm and dry.  Psychiatric: She has a normal mood and affect. Her behavior is normal. Judgment and thought content normal.          Assessment & Plan:

## 2013-02-18 NOTE — Telephone Encounter (Signed)
Reviewed neg [place]. Notified pt. She is resting, notes pain is worse with movement. Better with rest. Recommended empiric cipro and ibuprofen. Pt to call idf symptoms worsen, otherwise I have asked her to send me an update on Friday am on mychart letting me know how she is doing. She verbalizes understanding.

## 2013-02-20 ENCOUNTER — Telehealth: Payer: Self-pay | Admitting: Family

## 2013-02-20 ENCOUNTER — Encounter: Payer: Self-pay | Admitting: Family

## 2013-02-20 LAB — URINE CULTURE
Colony Count: NO GROWTH
Organism ID, Bacteria: NO GROWTH

## 2013-02-20 MED ORDER — AMPHETAMINE-DEXTROAMPHET ER 20 MG PO CP24: 20.0000 mg | ORAL_CAPSULE | ORAL | Status: DC

## 2013-02-20 NOTE — Telephone Encounter (Signed)
Rx printed and forwarded to Provider for signature. 

## 2013-02-20 NOTE — Telephone Encounter (Signed)
Patient left message on nurse voicemail stating that she is feeling better, just tired. She says that she does still feel some irritation.   Also, she is requesting a new prescription of adderall

## 2013-02-23 NOTE — Telephone Encounter (Signed)
signed

## 2013-02-23 NOTE — Telephone Encounter (Signed)
Left message on cell# that  Rx is ready for pick up at the front desk. 

## 2013-03-16 ENCOUNTER — Ambulatory Visit: Payer: Managed Care, Other (non HMO) | Admitting: Family

## 2013-03-23 ENCOUNTER — Encounter: Payer: Self-pay | Admitting: Family

## 2013-03-23 ENCOUNTER — Ambulatory Visit (INDEPENDENT_AMBULATORY_CARE_PROVIDER_SITE_OTHER): Payer: Managed Care, Other (non HMO) | Admitting: Family

## 2013-03-23 VITALS — BP 110/60 | HR 66 | Temp 98.6°F | Resp 16 | Ht 65.51 in | Wt 200.0 lb

## 2013-03-23 DIAGNOSIS — F17200 Nicotine dependence, unspecified, uncomplicated: Secondary | ICD-10-CM

## 2013-03-23 DIAGNOSIS — F909 Attention-deficit hyperactivity disorder, unspecified type: Secondary | ICD-10-CM

## 2013-03-23 DIAGNOSIS — Z720 Tobacco use: Secondary | ICD-10-CM

## 2013-03-23 MED ORDER — AMPHETAMINE-DEXTROAMPHET ER 10 MG PO CP24: 10.0000 mg | ORAL_CAPSULE | ORAL | Status: DC

## 2013-03-23 MED ORDER — AMPHETAMINE-DEXTROAMPHETAMINE 10 MG PO TABS: 10.0000 mg | ORAL_TABLET | Freq: Two times a day (BID) | ORAL | Status: DC

## 2013-03-23 NOTE — Patient Instructions (Addendum)
Follow up in 3 months, sooner if problems/concerns.  

## 2013-03-23 NOTE — [facility] Notes (Signed)
Subjective:    Patient ID: Joanne [name], female    DOB: Nov 30, 1986, 27 y.o.   MRN: [id number]  HPI  [name] is a 27 yr old female with hx of ADHD who presents today to discuss adderall rx.  Reports that she stopped adderall x 2 weeks due to numbness in fingers, chest tightness and dizziness. She reports resolution of the tingling since she stopped. She reports dizziness was over the weekend but now has resolved. During the dizziness she felt like she was going to faint. Dizziness started before she discontinued the adderal.  She feels that since she was switched from the regular release adderall to the extended release, she has felt the urge to smoke more cigarettes.  ADD - reports feeing very tired since she stopped the Adderall.  Notes that her co-workers have noticed that she is off of her medicine. One co-worker told her that she "makes less sense" since coming off.  Pt reports difficulty organizing her thoughts.   Has also been around daughter with "walking pneumonia". Pt denies any current cough or cold symptoms.   Review of Systems See HPI  Past Medical History  Diagnosis Date  . Asthma     childhood exercise induced  . Depression   . Toxic shock syndrome (TSS)     27 yrs old    History   Social History  . Marital Status: Single    Spouse Name: N/A    Number of Children: 2  . Years of Education: N/A   Occupational History  . Not on file.   Social History Main Topics  . Smoking status: Current Every Day Smoker    Types: Cigarettes  . Smokeless tobacco: Never Used     Comment: <1 pack cigarettes weekly.  . Alcohol Use: .5 - 2 oz/week    1-4 drink(s) per week  . Drug Use: No  . Sexually Active: Not on file   Other Topics Concern  . Not on file   Social History Narrative   Caffeine use: 1 cup coffee daily   Regular exercise: no    Daughter born in 2011.   Works at [employer] degree   Lives with daughter and her boyfriend- they are in  process of separating.              Past Surgical History  Procedure Laterality Date  . Cesarean section  2011  . Tonsillectomy  2004    Family History  Problem Relation Age of Onset  . Hypertension Father   . Bipolar disorder Maternal Uncle   . Arthritis Maternal Grandmother   . Cancer Maternal Grandmother     ovarian  . Depression Maternal Grandmother   . Bipolar disorder Maternal Grandmother   . Arthritis Maternal Grandfather   . Heart disease Maternal Grandfather   . Stroke Maternal Grandfather   . Hypertension Maternal Grandfather   . Arthritis Paternal Grandmother   . Cancer Paternal Grandmother   . Arthritis Paternal Grandfather   . Stroke Paternal Grandfather   . Hypertension Paternal Grandfather   . Cancer Other     breast    Allergies  Allergen Reactions  . Amoxicillin Hives  . Azithromycin Other (See Comments)    dizziness  . Penicillins Hives  . Thera-Gesic Rash    Current Outpatient Prescriptions on File Prior to Visit  Medication Sig Dispense Refill  . amphetamine-dextroamphetamine (ADDERALL XR) 20 MG 24 hr capsule Take 1 capsule (20  mg total) by mouth every morning.  30 capsule  0  . levonorgestrel (MIRENA) 20 MCG/24HR IUD 1 each by Intrauterine route once.       No current facility-administered medications on file prior to visit.    BP 110/60  Pulse 66  Temp(Src) 98.6 F (37 C) (Oral)  Resp 16  Ht 5' 5.51" (1.664 m)  Wt 200 lb 0.6 oz (90.738 kg)  BMI 32.77 kg/m2  SpO2 98%       Objective:   Physical Exam  Constitutional: She is oriented to person, place, and time. She appears well-developed and well-nourished. No distress.  Cardiovascular: Normal rate and regular rhythm.   No murmur heard. Pulmonary/Chest: Effort normal and breath sounds normal. No respiratory distress. She has no wheezes. She has no rales. She exhibits no tenderness.  Musculoskeletal: She exhibits no edema.  Neurological: She is alert and oriented to person,  place, and time.  Neg Tinels, neg Phalans  Psychiatric: She has a normal mood and affect. Her behavior is normal. Judgment and thought content normal.          Assessment & Plan:

## 2013-03-23 NOTE — Assessment & Plan Note (Addendum)
Deteriorated since discontinuing Adderall. Recommend that she resume, but will resume the regular release tabs.  Not clear what caused dizziness or tingling- however both of these symptoms have resolved.  Monitor.

## 2013-03-23 NOTE — Assessment & Plan Note (Signed)
We discussed importance of smoking cessation 

## 2013-04-13 ENCOUNTER — Telehealth: Payer: Self-pay | Admitting: *Deleted

## 2013-04-13 ENCOUNTER — Encounter: Payer: Self-pay | Admitting: *Deleted

## 2013-04-13 MED ORDER — AMPHETAMINE-DEXTROAMPHETAMINE 10 MG PO TABS: 10.0000 mg | ORAL_TABLET | Freq: Two times a day (BID) | ORAL | Status: DC

## 2013-04-13 NOTE — Telephone Encounter (Signed)
Pt left message requesting Adderall Rx. Rx last printed 03/23/13 #60 x no refills. Rx printed with note not to fill before 04/20/13 and forwarded to Provider for signature.

## 2013-04-14 NOTE — Telephone Encounter (Signed)
Rx placed at front desk for pick up and detailed message left on pt's voicemail. 

## 2013-04-14 NOTE — Telephone Encounter (Signed)
Signed.

## 2013-05-11 ENCOUNTER — Telehealth: Payer: Self-pay | Admitting: *Deleted

## 2013-05-11 NOTE — Telephone Encounter (Signed)
Pt left message requesting refill of adderall 10mg . Last rx given 03/23/13 #60 with note not to fill before 04/20/13.  Please advise re: refill.

## 2013-05-12 MED ORDER — AMPHETAMINE-DEXTROAMPHETAMINE 10 MG PO TABS: 10.0000 mg | ORAL_TABLET | Freq: Two times a day (BID) | ORAL | Status: DC

## 2013-05-12 NOTE — Telephone Encounter (Signed)
See rx. 

## 2013-05-13 NOTE — Telephone Encounter (Signed)
Rx placed at front desk for pick up, pt notified

## 2013-06-12 ENCOUNTER — Telehealth: Payer: Self-pay | Admitting: *Deleted

## 2013-06-12 NOTE — Telephone Encounter (Signed)
Request for ADDERALL Last Rx: 05.20.14 [NOT TO BE FILLED BEFORE 05.27.14], #60x0 Last OV: 03.31.14 Return: 3-Months, no appt scheduled Please advise on refill due On or After 06.26.14/SLS [facility] with contact name and number RE: Rx request response will be Monday as provider is out of office/SLS

## 2013-06-15 MED ORDER — AMPHETAMINE-DEXTROAMPHETAMINE 10 MG PO TABS: 10.0000 mg | ORAL_TABLET | Freq: Two times a day (BID) | ORAL | Status: DC

## 2013-06-15 NOTE — Telephone Encounter (Signed)
Notified pt. Scheduled f/u for 07/06/13 at 8:15. Rx placed at front desk for pick up.

## 2013-06-15 NOTE — Telephone Encounter (Signed)
See Rx 

## 2013-07-06 ENCOUNTER — Ambulatory Visit: Payer: Managed Care, Other (non HMO) | Admitting: Family

## 2013-09-16 ENCOUNTER — Ambulatory Visit (INDEPENDENT_AMBULATORY_CARE_PROVIDER_SITE_OTHER): Payer: Managed Care, Other (non HMO) | Admitting: Physician Assistant

## 2013-09-16 ENCOUNTER — Encounter: Payer: Self-pay | Admitting: Physician Assistant

## 2013-09-16 VITALS — BP 108/62 | HR 74 | Temp 97.7°F | Resp 16 | Ht 65.5 in | Wt 195.5 lb

## 2013-09-16 DIAGNOSIS — R110 Nausea: Secondary | ICD-10-CM

## 2013-09-16 DIAGNOSIS — B349 Viral infection, unspecified: Secondary | ICD-10-CM

## 2013-09-16 DIAGNOSIS — B9789 Other viral agents as the cause of diseases classified elsewhere: Secondary | ICD-10-CM

## 2013-09-16 MED ORDER — ONDANSETRON HCL 4 MG PO TABS: 4.0000 mg | ORAL_TABLET | Freq: Three times a day (TID) | ORAL | Status: DC | PRN

## 2013-09-16 NOTE — Assessment & Plan Note (Signed)
Seems to be resolving.  Rx Zofran to help with nausea.  Continue supportive measures.  Return if symptoms are not improving in the next few days.

## 2013-09-16 NOTE — Patient Instructions (Signed)
Please drink plenty of fluids and get plenty of rest.  Follow the BRAT diet below.  Use zofran as needed for nausea.  You should start feeling better in a couple of days.  Please call or return if symptoms persist or worsen.  B.R.A.T. Diet Your doctor has recommended the B.R.A.T. diet for you or your child until the condition improves. This is often used to help control diarrhea and vomiting symptoms. If you or your child can tolerate clear liquids, you may have:  Bananas.   Rice.   Applesauce.   Toast (and other simple starches such as crackers, potatoes, noodles).  Be sure to avoid dairy products, meats, and fatty foods until symptoms are better. Fruit juices such as apple, grape, and prune juice can make diarrhea worse. Avoid these. Continue this diet for 2 days or as instructed by your caregiver. Document Released: 12/10/2005 Document Revised: 11/29/2011 Document Reviewed: 05/29/2007 [facility] Patient Information 2012 [place], [place].

## 2013-09-16 NOTE — [facility] Notes (Signed)
Patient ID: Joanne [name], female   DOB: 10/04/1986, 27 y.o.   MRN: [id number]  Patient presents to clinic today c/o fatigue, body aches, nausea/vomiting, diarrhea x 3 days.  Denies cough or URI symptom.  States that a coworker had gastroenteritis recently.  She states that the vomiting and diarrhea are much improved from yesterday.  Nausea is lingering.  Denies fever, chills, rash, hematemesis.  Does endorse mild diffuse abdominal tenderness yesterday.    Past Medical History  Diagnosis Date  . Asthma     childhood exercise induced  . Depression   . Toxic shock syndrome (TSS)     27 yrs old    Current Outpatient Prescriptions on File Prior to Visit  Medication Sig Dispense Refill  . levonorgestrel (MIRENA) 20 MCG/24HR IUD 1 each by Intrauterine route once.       No current facility-administered medications on file prior to visit.    Allergies  Allergen Reactions  . Amoxicillin Hives  . Azithromycin Other (See Comments)    dizziness  . Penicillins Hives  . Thera-Gesic Rash    Family History  Problem Relation Age of Onset  . Hypertension Father   . Bipolar disorder Maternal Uncle   . Arthritis Maternal Grandmother   . Cancer Maternal Grandmother     ovarian  . Depression Maternal Grandmother   . Bipolar disorder Maternal Grandmother   . Arthritis Maternal Grandfather   . Heart disease Maternal Grandfather   . Stroke Maternal Grandfather   . Hypertension Maternal Grandfather   . Arthritis Paternal Grandmother   . Cancer Paternal Grandmother   . Arthritis Paternal Grandfather   . Stroke Paternal Grandfather   . Hypertension Paternal Grandfather   . Cancer Other     breast    History   Social History  . Marital Status: Single    Spouse Name: N/A    Number of Children: 2  . Years of Education: N/A   Social History Main Topics  . Smoking status: Former Smoker    Types: Cigarettes    Quit date: 06/23/2013  . Smokeless tobacco: Never Used  . Alcohol Use: .5 - 2  oz/week    1-4 drink(s) per week  . Drug Use: No  . Sexual Activity: None   Other Topics Concern  . None   Social History Narrative   Caffeine use: 1 cup coffee daily   Regular exercise: no    Daughter born in 2011.   Works at [employer] degree   Lives with daughter and her boyfriend- they are in process of separating.             ROS See HPI.  All other ROS negative.  Filed Vitals:   09/16/13 1010  BP: 108/62  Pulse: 74  Temp: 97.7 F (36.5 C)  Resp: 16   Physical Exam  Vitals reviewed. Constitutional: She is oriented to person, place, and time and well-developed, well-nourished, and in no distress.  HENT:  Head: Normocephalic and atraumatic.  Right Ear: External ear normal.  Left Ear: External ear normal.  Nose: Nose normal.  Mouth/Throat: Oropharynx is clear and moist. No oropharyngeal exudate.  TM WNL bilaterally.  No pressure to percussion of sinuses.  Neck: Neck supple.  Cardiovascular: Normal rate, regular rhythm and normal heart sounds.   Pulmonary/Chest: Effort normal and breath sounds normal. No respiratory distress. She has no wheezes. She has no rales. She exhibits no tenderness.  Abdominal: Soft. Bowel sounds  are normal. She exhibits no distension and no mass. There is no rebound and no guarding.  Mild diffuse abdominal tenderness noted.  Neurological: She is alert and oriented to person, place, and time.  Skin: Skin is warm and dry. No rash noted.    Assessment/Plan: Viral infection Seems to be resolving.  Rx Zofran to help with nausea.  Continue supportive measures.  Return if symptoms are not improving in the next few days.

## 2013-09-26 ENCOUNTER — Encounter (HOSPITAL_BASED_OUTPATIENT_CLINIC_OR_DEPARTMENT_OTHER): Payer: Self-pay

## 2013-09-26 ENCOUNTER — Emergency Department (HOSPITAL_BASED_OUTPATIENT_CLINIC_OR_DEPARTMENT_OTHER)
Admission: EM | Admit: 2013-09-26 | Discharge: 2013-09-26 | Disposition: A | Discharge status: Home / Self Care | Payer: Managed Care, Other (non HMO) | Attending: Emergency Medicine | Admitting: Emergency Medicine

## 2013-09-26 DIAGNOSIS — Z880 Allergy status to penicillin: Secondary | ICD-10-CM | POA: Insufficient documentation

## 2013-09-26 DIAGNOSIS — Z8619 Personal history of other infectious and parasitic diseases: Secondary | ICD-10-CM | POA: Insufficient documentation

## 2013-09-26 DIAGNOSIS — T6391XA Toxic effect of contact with unspecified venomous animal, accidental (unintentional), initial encounter: Secondary | ICD-10-CM | POA: Insufficient documentation

## 2013-09-26 DIAGNOSIS — T63441A Toxic effect of venom of bees, accidental (unintentional), initial encounter: Secondary | ICD-10-CM

## 2013-09-26 DIAGNOSIS — T63461A Toxic effect of venom of wasps, accidental (unintentional), initial encounter: Secondary | ICD-10-CM | POA: Insufficient documentation

## 2013-09-26 DIAGNOSIS — Z8659 Personal history of other mental and behavioral disorders: Secondary | ICD-10-CM | POA: Insufficient documentation

## 2013-09-26 DIAGNOSIS — Y929 Unspecified place or not applicable: Secondary | ICD-10-CM | POA: Insufficient documentation

## 2013-09-26 DIAGNOSIS — J45909 Unspecified asthma, uncomplicated: Secondary | ICD-10-CM | POA: Insufficient documentation

## 2013-09-26 DIAGNOSIS — Y939 Activity, unspecified: Secondary | ICD-10-CM | POA: Insufficient documentation

## 2013-09-26 DIAGNOSIS — Z87891 Personal history of nicotine dependence: Secondary | ICD-10-CM | POA: Insufficient documentation

## 2013-09-26 MED ORDER — PREDNISONE 50 MG PO TABS
60.0000 mg | ORAL_TABLET | Freq: Once | ORAL | Status: AC
  Administered 2013-09-26: 60 mg via ORAL
  Filled 2013-09-26 (×2): qty 1

## 2013-09-26 MED ORDER — PREDNISONE 50 MG PO TABS: ORAL_TABLET | ORAL | Status: DC

## 2013-09-26 MED ORDER — FAMOTIDINE 20 MG PO TABS
20.0000 mg | ORAL_TABLET | ORAL | Status: AC
  Administered 2013-09-26: 20 mg via ORAL
  Filled 2013-09-26: qty 1

## 2013-09-26 MED ORDER — DIPHENHYDRAMINE HCL 25 MG PO CAPS
50.0000 mg | ORAL_CAPSULE | Freq: Once | ORAL | Status: AC
  Administered 2013-09-26: 50 mg via ORAL
  Filled 2013-09-26: qty 2

## 2013-09-26 NOTE — ED Notes (Signed)
Patient here with itching and hives from bee stings last pm. Redness and itching to hands and shoulder. Took benadryl last pm but none today, no distress
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
## 2013-10-29 ENCOUNTER — Other Ambulatory Visit: Payer: Self-pay

## 2013-12-01 ENCOUNTER — Encounter: Payer: Self-pay | Admitting: Family

## 2013-12-01 ENCOUNTER — Ambulatory Visit (INDEPENDENT_AMBULATORY_CARE_PROVIDER_SITE_OTHER): Payer: Managed Care, Other (non HMO) | Admitting: Family

## 2013-12-01 VITALS — BP 100/70 | HR 103 | Temp 97.4°F | Resp 18 | Wt 204.0 lb

## 2013-12-01 DIAGNOSIS — B9789 Other viral agents as the cause of diseases classified elsewhere: Secondary | ICD-10-CM

## 2013-12-01 DIAGNOSIS — R509 Fever, unspecified: Secondary | ICD-10-CM

## 2013-12-01 DIAGNOSIS — B349 Viral infection, unspecified: Secondary | ICD-10-CM

## 2013-12-01 DIAGNOSIS — E010 Iodine-deficiency related diffuse (endemic) goiter: Secondary | ICD-10-CM

## 2013-12-01 DIAGNOSIS — E049 Nontoxic goiter, unspecified: Secondary | ICD-10-CM

## 2013-12-01 LAB — POCT INFLUENZA A/B
Influenza A, POC: NEGATIVE
Influenza B, POC: NEGATIVE

## 2013-12-01 NOTE — [facility] Notes (Signed)
Pre visit review using our clinic review tool, if applicable. No additional management support is needed unless otherwise documented below in the visit note. 

## 2013-12-01 NOTE — Patient Instructions (Signed)
You may use imodium as needed for diarrhea, alternated tylenol and motrin as needed for pain or fever. Call if symptoms worsen or if not improved in 2-3 days.
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
## 2013-12-02 ENCOUNTER — Encounter: Payer: Self-pay | Admitting: Family

## 2013-12-03 NOTE — Assessment & Plan Note (Signed)
Rapid strep and flu swab negative. Advised supportive measures, call if symptoms worsen or if not improved in 2-3 days.

## 2013-12-04 ENCOUNTER — Telehealth: Payer: Self-pay | Admitting: Family

## 2013-12-04 DIAGNOSIS — E010 Iodine-deficiency related diffuse (endemic) goiter: Secondary | ICD-10-CM | POA: Insufficient documentation

## 2013-12-04 NOTE — Addendum Note (Signed)
Addended by: Sandford Craze on: 12/04/2013 10:35 AM   Modules accepted: Level of Service

## 2013-12-04 NOTE — Assessment & Plan Note (Signed)
New- Refer for thyroid ultrasound.

## 2013-12-08 ENCOUNTER — Ambulatory Visit (HOSPITAL_BASED_OUTPATIENT_CLINIC_OR_DEPARTMENT_OTHER): Payer: Managed Care, Other (non HMO)

## 2013-12-09 NOTE — Telephone Encounter (Signed)
Late entry, phoned pt she reports she is feeling better. Agreeable to proceed with [place] for thyromegally.

## 2013-12-15 ENCOUNTER — Ambulatory Visit (HOSPITAL_BASED_OUTPATIENT_CLINIC_OR_DEPARTMENT_OTHER)
Admission: RE | Admit: 2013-12-15 | Discharge: 2013-12-15 | Disposition: A | Discharge status: Home / Self Care | Payer: Managed Care, Other (non HMO) | Source: Ambulatory Visit | Attending: Family | Admitting: Family

## 2013-12-15 DIAGNOSIS — E010 Iodine-deficiency related diffuse (endemic) goiter: Secondary | ICD-10-CM

## 2013-12-15 DIAGNOSIS — E049 Nontoxic goiter, unspecified: Secondary | ICD-10-CM | POA: Insufficient documentation

## 2013-12-16 ENCOUNTER — Encounter: Payer: Self-pay | Admitting: Family

## 2014-05-07 ENCOUNTER — Ambulatory Visit (INDEPENDENT_AMBULATORY_CARE_PROVIDER_SITE_OTHER): Payer: Managed Care, Other (non HMO) | Admitting: Family

## 2014-05-07 ENCOUNTER — Encounter: Payer: Self-pay | Admitting: Family

## 2014-05-07 VITALS — BP 116/74 | HR 75 | Temp 97.8°F | Resp 16 | Ht 65.5 in | Wt 201.0 lb

## 2014-05-07 DIAGNOSIS — R300 Dysuria: Secondary | ICD-10-CM

## 2014-05-07 DIAGNOSIS — Z7251 High risk heterosexual behavior: Secondary | ICD-10-CM | POA: Insufficient documentation

## 2014-05-07 MED ORDER — CIPROFLOXACIN HCL 500 MG PO TABS: 500.0000 mg | ORAL_TABLET | Freq: Two times a day (BID) | ORAL | Status: DC

## 2014-05-07 NOTE — [facility] Notes (Signed)
Pre visit review using our clinic review tool, if applicable. No additional management support is needed unless otherwise documented below in the visit note. 

## 2014-05-07 NOTE — Patient Instructions (Addendum)
Complete lab work prior to leaving. We will contact you regarding your lab work. Call if symptoms worsen or if symptoms do not improve.

## 2014-05-07 NOTE — Assessment & Plan Note (Signed)
Empiric rx with cipro, urine sent for culture.

## 2014-05-07 NOTE — Addendum Note (Signed)
Addended by: Mervin KungFERGERSON, Alverna Fawley A on: 05/07/2014 02:56 PM   Modules accepted: Orders
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
## 2014-05-07 NOTE — Assessment & Plan Note (Signed)
Pt wants STD testing. GC/Chlamydia and wet prep performed today. She will complete blood work for HIV/HSV today.  Advised pt on safe sex.

## 2014-05-08 LAB — GC/CHLAMYDIA PROBE AMP
CT PROBE, AMP APTIMA: NEGATIVE
GC PROBE AMP APTIMA: NEGATIVE

## 2014-05-08 LAB — HIV ANTIBODY (ROUTINE TESTING W REFLEX): HIV 1&2 Ab, 4th Generation: NONREACTIVE

## 2014-05-08 LAB — WET PREP BY MOLECULAR PROBE
Candida species: NEGATIVE
GARDNERELLA VAGINALIS: POSITIVE — AB
Trichomonas vaginosis: NEGATIVE

## 2014-05-09 ENCOUNTER — Telehealth: Payer: Self-pay | Admitting: Family

## 2014-05-09 DIAGNOSIS — Z7251 High risk heterosexual behavior: Secondary | ICD-10-CM

## 2014-05-09 MED ORDER — METRONIDAZOLE 500 MG PO TABS: 500.0000 mg | ORAL_TABLET | Freq: Two times a day (BID) | ORAL | Status: DC

## 2014-05-09 NOTE — Telephone Encounter (Signed)
GC/Chlamydia is negative.  HIV screen negative.  I would recommend that she repeat HIV screen in 3 months.  Herpes testing is still pending. Urine shows UTI- she should complete cipro.  Wet prep shows bacterial vaginosis.  Will rx with metronidazole.  No alcohol while on metronidazole.

## 2014-05-10 LAB — HSV 1 ANTIBODY, IGG: HSV 1 Glycoprotein G Ab, IgG: 4.08 IV — ABNORMAL HIGH

## 2014-05-10 LAB — HSV 2 ANTIBODY, IGG: HSV 2 Glycoprotein G Ab, IgG: 0.14 IV

## 2014-05-10 LAB — URINE CULTURE

## 2014-05-10 NOTE — Addendum Note (Signed)
Addended by: Mervin KungFERGERSON, Kingstyn Deruiter A on: 05/10/2014 06:57 PM   Modules accepted: Orders

## 2014-05-10 NOTE — Telephone Encounter (Signed)
Left detailed message on cell and to call if any questions. 

## 2014-05-11 LAB — HSV(HERPES SIMPLEX VRS) I + II AB-IGM: Herpes Simplex Vrs I&II-IgM Ab (EIA): 0.92 INDEX

## 2014-07-02 ENCOUNTER — Encounter: Payer: Self-pay | Admitting: Physician Assistant

## 2014-07-02 ENCOUNTER — Ambulatory Visit (INDEPENDENT_AMBULATORY_CARE_PROVIDER_SITE_OTHER): Payer: Managed Care, Other (non HMO) | Admitting: Physician Assistant

## 2014-07-02 VITALS — BP 114/72 | HR 75 | Temp 98.2°F | Resp 16 | Ht 65.5 in | Wt 196.2 lb

## 2014-07-02 DIAGNOSIS — F411 Generalized anxiety disorder: Secondary | ICD-10-CM

## 2014-07-02 DIAGNOSIS — F419 Anxiety disorder, unspecified: Secondary | ICD-10-CM

## 2014-07-02 MED ORDER — ESCITALOPRAM OXALATE 10 MG PO TABS: ORAL_TABLET | ORAL | Status: DC

## 2014-07-02 NOTE — [facility] Notes (Signed)
Pre visit review using our clinic review tool, if applicable. No additional management support is needed unless otherwise documented below in the visit note/SLS  

## 2014-07-02 NOTE — Patient Instructions (Signed)
Please take medication as directed.  Read over the handout on the Counseling Services offered.  I would recommend a therapist to help develop coping mechanisms for your social anxiety.  Follow-up with Melissa in 1 month.  Return sooner if needed.
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
## 2014-07-04 NOTE — Assessment & Plan Note (Signed)
Will begin Lexapro. Patient given information for [facility] counselors.  Highly recommend patient call for an appointment.  ADRS of medication discussed with patient.  Follow-up with PCP in 1 month.

## 2014-08-06 ENCOUNTER — Telehealth: Payer: Self-pay | Admitting: Family

## 2014-08-06 ENCOUNTER — Ambulatory Visit: Payer: Managed Care, Other (non HMO) | Admitting: Family

## 2014-08-06 DIAGNOSIS — Z0289 Encounter for other administrative examinations: Secondary | ICD-10-CM

## 2014-08-06 NOTE — Telephone Encounter (Signed)
Patient had to cancel appt today. She will call back to reschedule

## 2014-09-02 ENCOUNTER — Other Ambulatory Visit: Payer: Self-pay | Admitting: Physician Assistant

## 2014-09-30 ENCOUNTER — Other Ambulatory Visit: Payer: Self-pay | Admitting: Physician Assistant

## 2014-10-04 ENCOUNTER — Encounter: Payer: Self-pay | Admitting: Family

## 2014-10-04 ENCOUNTER — Ambulatory Visit (INDEPENDENT_AMBULATORY_CARE_PROVIDER_SITE_OTHER): Payer: Managed Care, Other (non HMO) | Admitting: Family

## 2014-10-04 VITALS — BP 114/69 | HR 61 | Temp 98.5°F | Resp 59 | Ht 65.5 in | Wt 198.2 lb

## 2014-10-04 DIAGNOSIS — R635 Abnormal weight gain: Secondary | ICD-10-CM

## 2014-10-04 DIAGNOSIS — N939 Abnormal uterine and vaginal bleeding, unspecified: Secondary | ICD-10-CM

## 2014-10-04 DIAGNOSIS — F418 Other specified anxiety disorders: Secondary | ICD-10-CM

## 2014-10-04 DIAGNOSIS — N898 Other specified noninflammatory disorders of vagina: Secondary | ICD-10-CM

## 2014-10-04 DIAGNOSIS — E663 Overweight: Secondary | ICD-10-CM

## 2014-10-04 MED ORDER — ESCITALOPRAM OXALATE 20 MG PO TABS: 20.0000 mg | ORAL_TABLET | Freq: Every day | ORAL | Status: DC

## 2014-10-04 NOTE — [facility] Notes (Signed)
Pre visit review using our clinic review tool, if applicable. No additional management support is needed unless otherwise documented below in the visit note. 

## 2014-10-04 NOTE — [facility] Notes (Signed)
Subjective:    Patient ID: Joanne [name], female    DOB: Mar 23, 1986, 28 y.o.   MRN: [id number]  HPI Joanne [name] is a 28 year old female who presents today wanting to discuss issues regarding her health.   1. Anxiety - Started taking Lexapro about 3 months ago.  Reports that she feels like it is helping with the social anxiety, and feels more calm and less on edge.  She reports that she feels like she still gets "bursts" of anxiety at random times and this comes and goes.  She feels like her mind is racing and her heart is beating really fast during these episodes, which occur about once a week.     2. Weight loss- She reports not being able to lose 40 pounds.  She has worked with a Systems analystpersonal trainer for 8 months at least three times per week.  She would like to discuss her options for weight loss. Diet includes lean meats and vegtables.  Caloric intake is 1400-1600 calories. She does not eat processed foods expect for one time per week.   3. Vaginal spotting - Mirena was inserted 4 years ago. Has had some increased bright red spotting for two weeks and her partner reports that the string is painful when having intercourse.  She would like to ensure that Mirena is in correct position.      Review of Systems  Constitutional: Negative for fever, fatigue and unexpected weight change.  HENT: Negative.   Respiratory: Negative for cough, chest tightness and shortness of breath.   Cardiovascular: Negative for chest pain, palpitations and leg swelling.  Musculoskeletal: Negative.   Skin: Negative.  Negative for pallor and rash.  Psychiatric/Behavioral: Negative.    Past Medical History  Diagnosis Date  . Asthma     childhood exercise induced  . Depression   . Toxic shock syndrome (TSS)     28 yrs old    History   Social History  . Marital Status: Single    Spouse Name: N/A    Number of Children: 2  . Years of Education: N/A   Occupational History  . Not on file.   Social History  Main Topics  . Smoking status: Former Smoker    Types: Cigarettes    Quit date: 06/23/2013  . Smokeless tobacco: Never Used  . Alcohol Use: 0.5 - 2.0 oz/week    1-4 drink(s) per week  . Drug Use: No  . Sexual Activity: Not on file   Other Topics Concern  . Not on file   Social History Narrative   Caffeine use: 1 cup coffee daily   Regular exercise: no    Daughter born in 2011.   Works at [employer]   Single   Bachelors degree   Lives with daughter and her boyfriend- they are in process of separating.              Past Surgical History  Procedure Laterality Date  . Cesarean section  2011  . Tonsillectomy  2004    Family History  Problem Relation Age of Onset  . Hypertension Father   . Bipolar disorder Maternal Uncle   . Arthritis Maternal Grandmother   . Cancer Maternal Grandmother     ovarian  . Depression Maternal Grandmother   . Bipolar disorder Maternal Grandmother   . Arthritis Maternal Grandfather   . Heart disease Maternal Grandfather   . Stroke Maternal Grandfather   . Hypertension Maternal Grandfather   .  Arthritis Paternal Grandmother   . Cancer Paternal Grandmother   . Arthritis Paternal Grandfather   . Stroke Paternal Grandfather   . Hypertension Paternal Grandfather   . Cancer Other     breast    Allergies  Allergen Reactions  . Amoxicillin Hives  . Azithromycin Other (See Comments)    dizziness  . Penicillins Hives  . Thera-Gesic Rash    Current Outpatient Prescriptions on File Prior to Visit  Medication Sig Dispense Refill  . B Complex Vitamins (VITAMIN B COMPLEX) TABS Take by mouth.      . levonorgestrel (MIRENA) 20 MCG/24HR IUD 1 each by Intrauterine route once.      . Multiple Vitamins-Calcium (ONE-A-DAY WOMENS FORMULA PO) Take by mouth.       No current facility-administered medications on file prior to visit.    BP 114/69  Pulse 61  Temp(Src) 98.5 F (36.9 C) (Oral)  Resp 59  Ht 5' 5.5" (1.664 m)  Wt 198 lb 3.2 oz  (89.903 kg)  BMI 32.47 kg/m2  SpO2 100%        Objective:   Physical Exam  Constitutional: She appears well-developed and well-nourished.  HENT:  Head: Normocephalic and atraumatic.  Neck: Normal range of motion. Neck supple.  Cardiovascular: Normal rate, regular rhythm and normal heart sounds.   No murmur heard. Pulmonary/Chest: Effort normal and breath sounds normal. No respiratory distress.  Skin: Skin is warm and dry.  Psychiatric: She has a normal mood and affect.  Pelvic:  iud string noted in cervical os        Assessment & Plan:  Will increase Lexapro to 20mg  qd.  Will check TSH today.  Continue with exercise regimen and reduce caloric intake to 1200-1400.

## 2014-10-04 NOTE — Patient Instructions (Signed)
Increase lexapro from 10mg  to 20mg . Try to limit your calories from 1200 to 1400 a day, continue your regular exercise.  Follow up in 1 month.

## 2014-10-05 ENCOUNTER — Encounter: Payer: Self-pay | Admitting: Family

## 2014-10-05 DIAGNOSIS — E663 Overweight: Secondary | ICD-10-CM | POA: Insufficient documentation

## 2014-10-05 DIAGNOSIS — N939 Abnormal uterine and vaginal bleeding, unspecified: Secondary | ICD-10-CM | POA: Insufficient documentation

## 2014-10-05 LAB — TSH: TSH: 0.57 u[IU]/mL (ref 0.35–4.50)

## 2014-10-05 NOTE — Assessment & Plan Note (Signed)
Discuss limiting her calorie intake to 1200-1400. TSH is performed and is normal.

## 2014-10-05 NOTE — Assessment & Plan Note (Signed)
Some spotting is normal with mirena.  Monitor.

## 2014-10-05 NOTE — Assessment & Plan Note (Signed)
Will increase lexapro from 10mg  to 20mg .

## 2014-10-19 ENCOUNTER — Encounter: Payer: Self-pay | Admitting: Family

## 2014-10-23 IMAGING — US US PELVIS COMPLETE
1 series · 13 of 25 positions shown · non-contrast
Comparison: None

CLINICAL DATA: 26-year-old female with left pelvic pain.  History
of IUD and ovarian cyst.



[Series 1: us pelvis complete · 0.32mm/px · 13 of 54 slices shown]
[im 1/54]
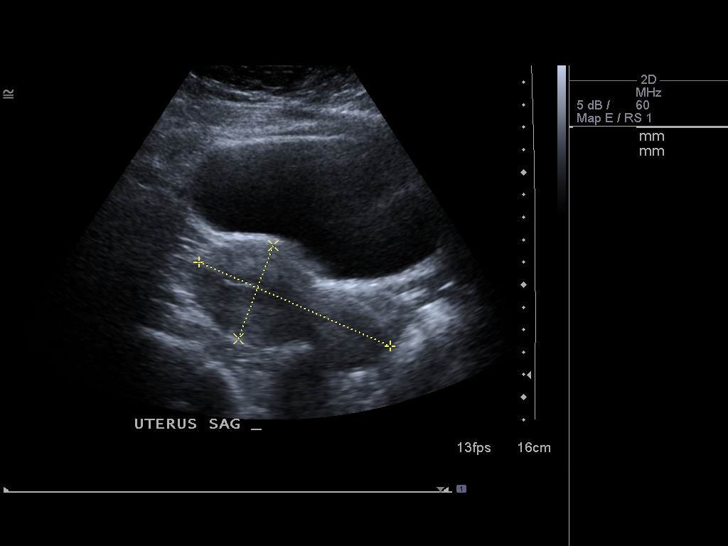
[im 5/54]
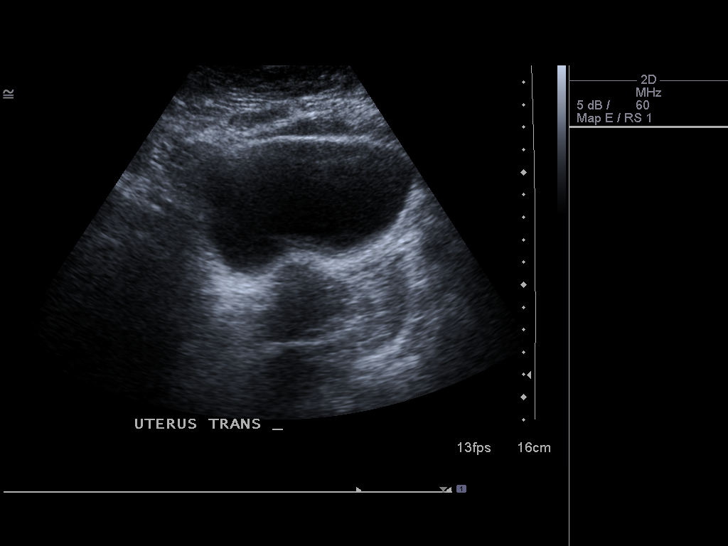
[im 9/54]
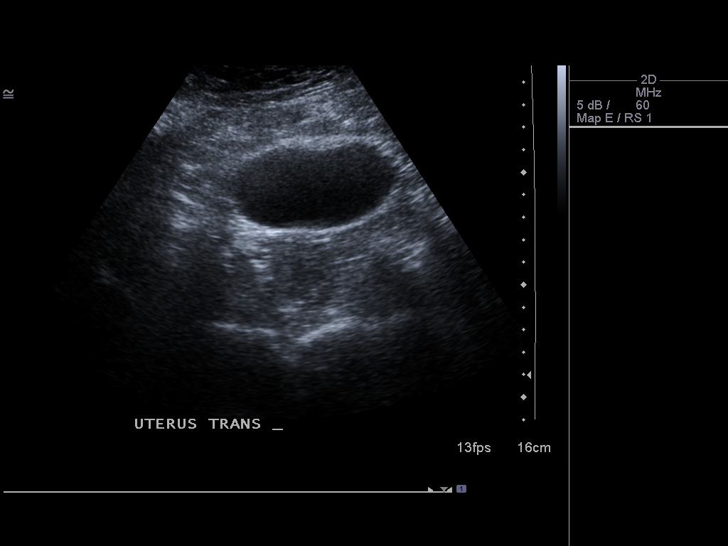
[im 14/54]
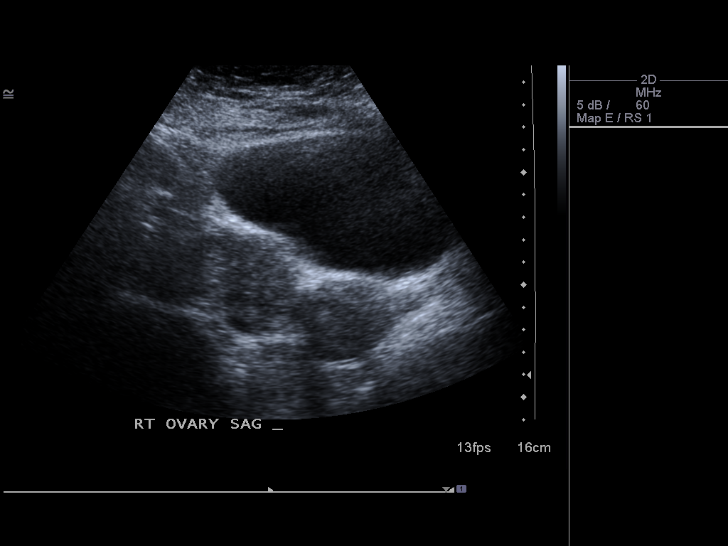
[im 18/54]
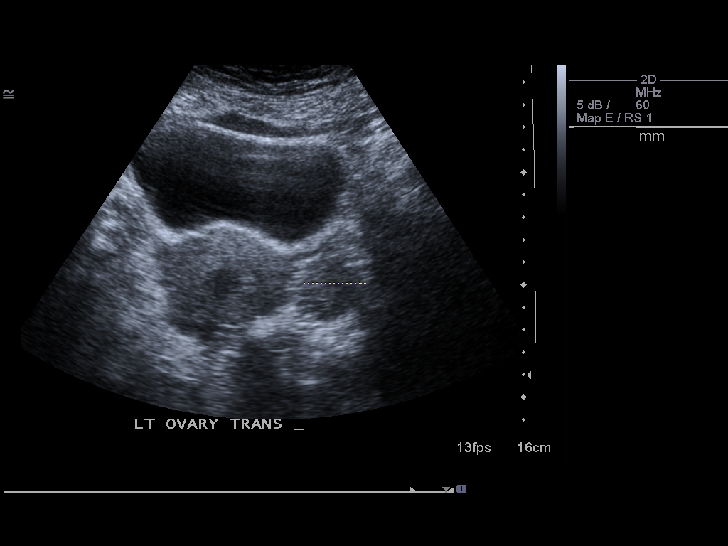
[im 23/54]
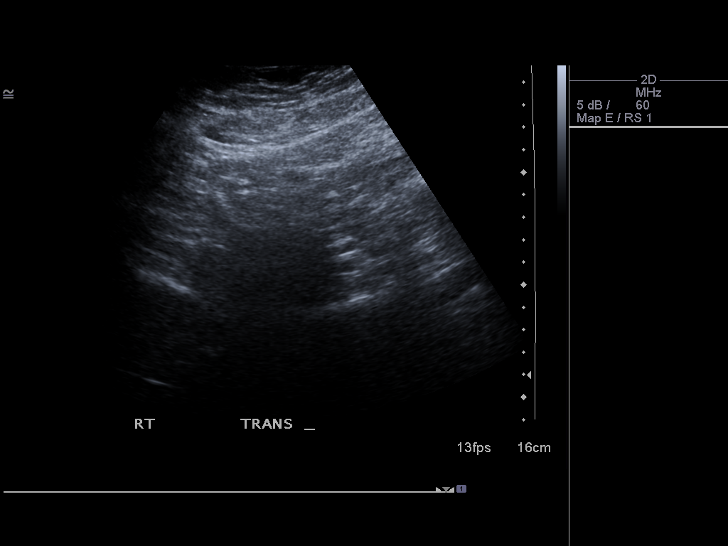
[im 27/54]
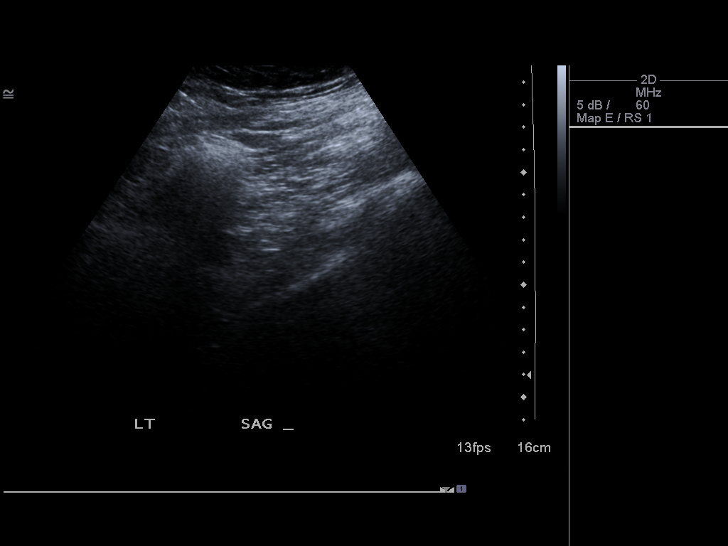
[im 31/54]
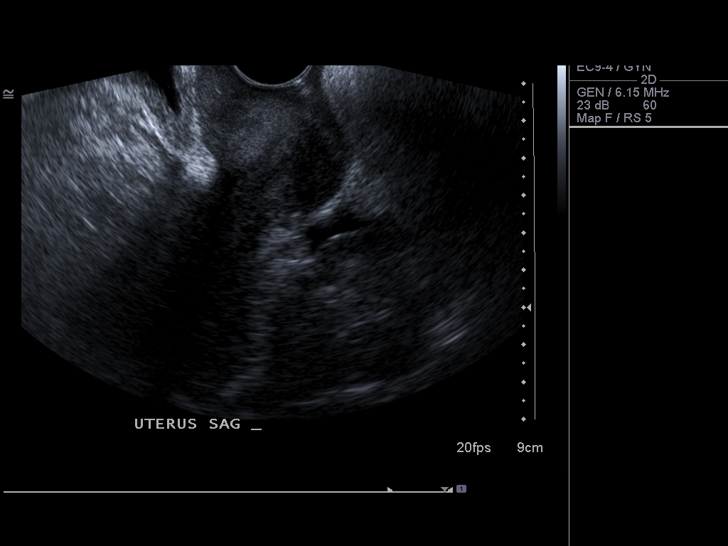
[im 36/54]
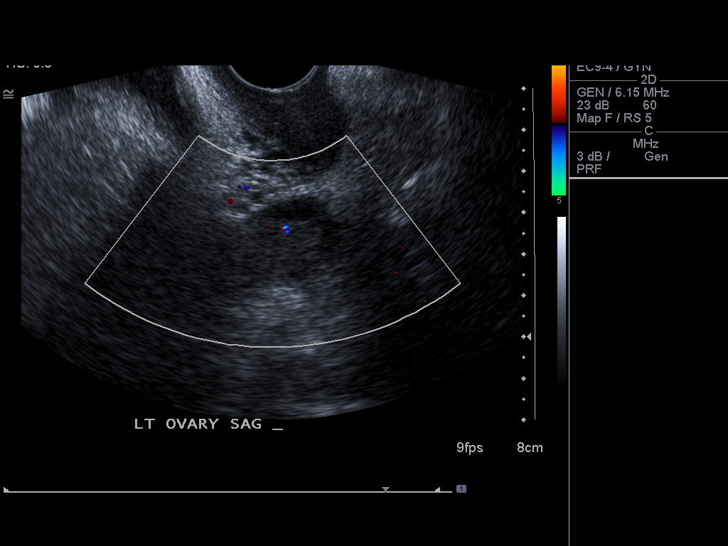
[im 40/54]
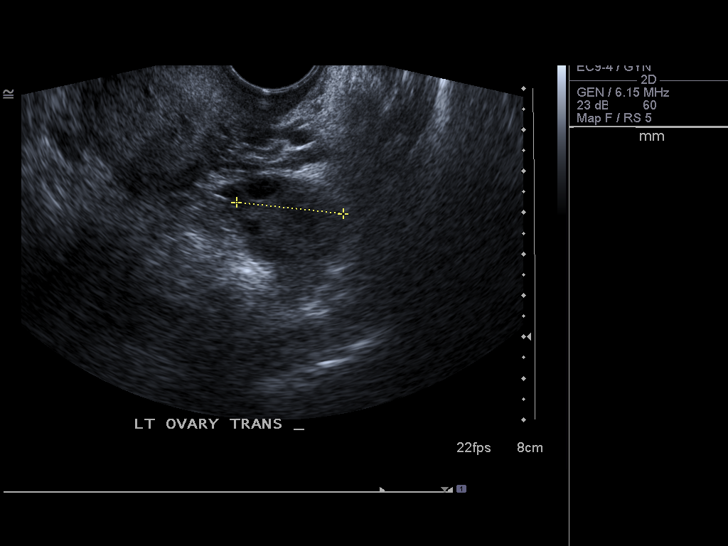
[im 45/54]
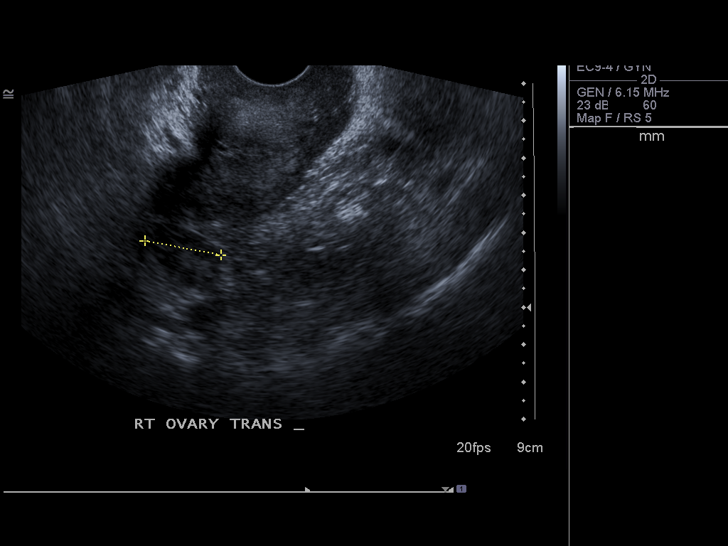
[im 49/54]
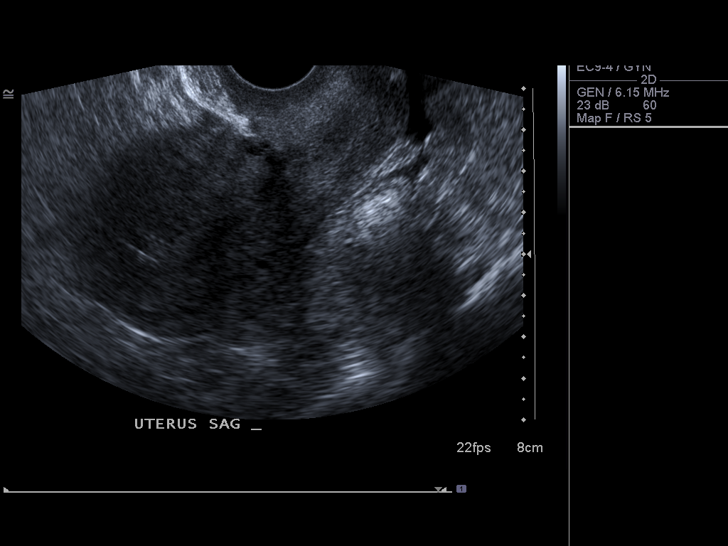
[im 54/54]
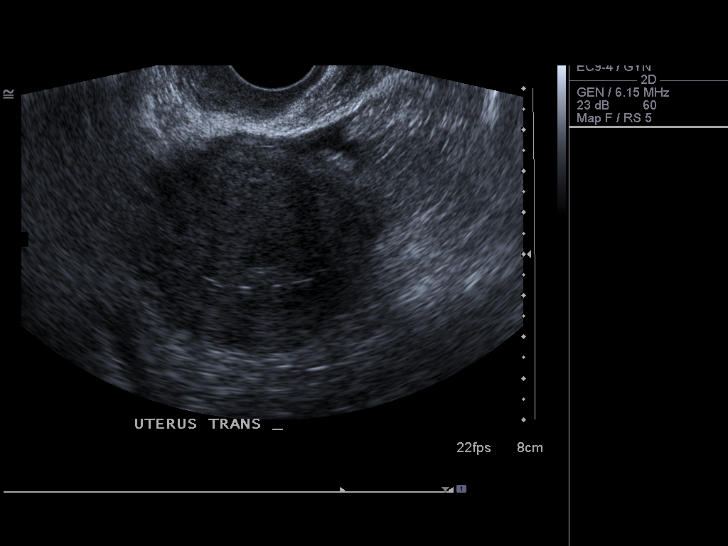

[13 of 25 positions shown; findings below may reference images not displayed]

FINDINGS: Uterus: The uterus is anteverted measuring 9.3 x 4.4 x 4.3 cm.  No
focal uterine masses are identified.

Endometrium: An IUD is identified within the uterus.  The
endometrium is grossly unremarkable.  There is a small amount of
fluid in the cervical canal.

Right ovary:  The right ovary is normal measuring 3 x 1.5 x 2.1 cm.

Left ovary: The left ovary is normal measuring 2.2 x 2.1 x 2.6 cm.

Other findings: A trace amount of free pelvic fluid is noted.
There is no evidence of adnexal mass.
IMPRESSION: Trace amount of free pelvic fluid which may be physiologic.

IUD with small amount of nonspecific fluid within the cervical
canal

Unremarkable ovaries bilaterally..

## 2014-11-01 ENCOUNTER — Other Ambulatory Visit: Payer: Self-pay | Admitting: Family

## 2014-11-08 ENCOUNTER — Ambulatory Visit: Payer: Managed Care, Other (non HMO) | Admitting: Family

## 2014-11-08 ENCOUNTER — Telehealth: Payer: Self-pay | Admitting: *Deleted

## 2014-11-08 ENCOUNTER — Encounter: Payer: Self-pay | Admitting: Family

## 2014-11-08 NOTE — Telephone Encounter (Signed)
Please send recall note.

## 2014-11-08 NOTE — Telephone Encounter (Signed)
Pt did not show for appointment 11/08/2014 at 9:45am for 1 month follow up

## 2014-11-08 NOTE — Telephone Encounter (Signed)
No show letter mailed 11/08/2014

## 2014-12-01 ENCOUNTER — Other Ambulatory Visit: Payer: Self-pay | Admitting: Family

## 2014-12-02 NOTE — Telephone Encounter (Signed)
Lexaprol refilled for one month. OV needed for follow up. JG//CMA

## 2015-01-04 ENCOUNTER — Telehealth: Payer: Self-pay | Admitting: Family

## 2015-01-04 MED ORDER — ESCITALOPRAM OXALATE 20 MG PO TABS: 20.0000 mg | ORAL_TABLET | Freq: Every day | ORAL | Status: DC

## 2015-01-04 NOTE — Telephone Encounter (Signed)
Caller name: Molinda Bailiffaft, Abuk Jo Relation to pt: self  Call back number: [phone] [facility]acy[facility]   Reason for call:  Pt requesting only escitalopram (LEXAPRO) 20 MG tablet [facility]. Pt states only 2 pills left and would like a few pills sent to retail pharmacy until mail order is received. Retail pharmacy is   [id number] [place], [place] - [address]

## 2015-01-04 NOTE — Telephone Encounter (Signed)
[facility] [employer] [facility] . Verified with pt that [name] is correct mail order pharmacy. Pt also [facility] local [facility]. Refill sent. Advised pt she is past due for 1 month f/u and pt states medication is working well and she doesn't feel dose needs further adjustments and is declining appt at this time.  Please advise.

## 2015-01-04 NOTE — Telephone Encounter (Signed)
Mailed letter to pt re: need for follow up in 3 months.

## 2015-01-04 NOTE — Telephone Encounter (Signed)
OK to bring pt back for 3 month follow up.

## 2015-01-04 NOTE — Telephone Encounter (Signed)
Please call pt to arrange f/u in 3 months.

## 2015-01-04 NOTE — Telephone Encounter (Signed)
Left detailed message informing patient of medication refill and to call our office to schedule appointment °

## 2015-04-20 ENCOUNTER — Ambulatory Visit (INDEPENDENT_AMBULATORY_CARE_PROVIDER_SITE_OTHER): Payer: Managed Care, Other (non HMO) | Admitting: Family

## 2015-04-20 ENCOUNTER — Encounter: Payer: Self-pay | Admitting: Family

## 2015-04-20 VITALS — BP 102/70 | HR 79 | Temp 98.1°F | Resp 16 | Ht 65.5 in | Wt 211.4 lb

## 2015-04-20 DIAGNOSIS — Z23 Encounter for immunization: Secondary | ICD-10-CM

## 2015-04-20 DIAGNOSIS — F418 Other specified anxiety disorders: Secondary | ICD-10-CM

## 2015-04-20 DIAGNOSIS — F329 Major depressive disorder, single episode, unspecified: Secondary | ICD-10-CM

## 2015-04-20 DIAGNOSIS — F419 Anxiety disorder, unspecified: Principal | ICD-10-CM

## 2015-04-20 MED ORDER — ESCITALOPRAM OXALATE 5 MG PO TABS: 5.0000 mg | ORAL_TABLET | Freq: Every day | ORAL | Status: DC

## 2015-04-20 NOTE — Assessment & Plan Note (Signed)
Plan taper off of lexapro as outlined in AVS. Pamphlet on [facility] provided to patient. Follow up in 6 weeks.

## 2015-04-20 NOTE — [facility] Notes (Signed)
Pre visit review using our clinic review tool, if applicable. No additional management support is needed unless otherwise documented below in the visit note. 

## 2015-04-20 NOTE — Addendum Note (Signed)
Addended by: Mervin KungFERGERSON, Ruchi Stoney A on: 04/20/2015 11:12 AM   Modules accepted: Orders

## 2015-04-20 NOTE — Patient Instructions (Addendum)
Cut lexapro in half and take 1/2 tab by mouth once daily for 2 weeks, then 5 mg once daily x 2 weeks then stop. Follow up in 6 weeks.

## 2015-04-20 NOTE — [facility] Notes (Signed)
Subjective:    Patient ID: Joanne [name], female    DOB: 03/21/1986, 29 y.o.   MRN: [id number]  HPI  [name] is a 29 yr old female who presents today for follow up of anxiety and depression. Reports symptoms are well controlled. She was originally started on lexapro back in 7/15. Reports + weight gain, HA's, low libido. She is interested in trying a "more holistic" approach.    Review of Systems See HPI  Past Medical History  Diagnosis Date  . Asthma     childhood exercise induced  . Depression   . Toxic shock syndrome (TSS)     29 yrs old    History   Social History  . Marital Status: Single    Spouse Name: N/A  . Number of Children: 2  . Years of Education: N/A   Occupational History  . Not on file.   Social History Main Topics  . Smoking status: Current Every Day Smoker    Types: Cigarettes    Last Attempt to Quit: 06/23/2013  . Smokeless tobacco: Never Used     Comment: 1-10 cigarettes daily  . Alcohol Use: 0.6 - 2.4 oz/week    1-4 Standard drinks or equivalent per week  . Drug Use: No  . Sexual Activity: Not on file   Other Topics Concern  . Not on file   Social History Narrative   Caffeine use: 1 cup coffee daily   Regular exercise: no    Daughter born in 2011.   Works at [employer] degree   Lives with daughter and her boyfriend- they are in process of separating.              Past Surgical History  Procedure Laterality Date  . Cesarean section  2011  . Tonsillectomy  2004    Family History  Problem Relation Age of Onset  . Hypertension Father   . Bipolar disorder Maternal Uncle   . Arthritis Maternal Grandmother   . Cancer Maternal Grandmother     ovarian  . Depression Maternal Grandmother   . Bipolar disorder Maternal Grandmother   . Arthritis Maternal Grandfather   . Heart disease Maternal Grandfather   . Stroke Maternal Grandfather   . Hypertension Maternal Grandfather   . Arthritis Paternal  Grandmother   . Cancer Paternal Grandmother   . Arthritis Paternal Grandfather   . Stroke Paternal Grandfather   . Hypertension Paternal Grandfather   . Cancer Other     breast    Allergies  Allergen Reactions  . Amoxicillin Hives  . Azithromycin Other (See Comments)    dizziness  . Penicillins Hives  . Thera-Gesic Rash    Current Outpatient Prescriptions on File Prior to Visit  Medication Sig Dispense Refill  . escitalopram (LEXAPRO) 20 MG tablet Take 1 tablet (20 mg total) by mouth daily. 90 tablet 0  . levonorgestrel (MIRENA) 20 MCG/24HR IUD 1 each by Intrauterine route once.     No current facility-administered medications on file prior to visit.    BP 102/70 mmHg  Pulse 79  Temp(Src) 98.1 F (36.7 C) (Oral)  Resp 16  Ht 5' 5.5" (1.664 m)  Wt 211 lb 6.4 oz (95.89 kg)  BMI 34.63 kg/m2  SpO2 99%       Objective:   Physical Exam  Constitutional: She is oriented to person, place, and time. She appears well-developed and well-nourished. No distress.  Neurological: She is alert and  oriented to person, place, and time.  Psychiatric: She has a normal mood and affect. Her behavior is normal. Judgment and thought content normal.          Assessment & Plan:  15 min spent with pt today. > 50% of this time was spent counseling pt on anxiety and depression.

## 2015-08-19 IMAGING — US US SOFT TISSUE HEAD/NECK
1 series · 14 of 25 positions shown · non-contrast
Comparison: None.

CLINICAL DATA: Thyromegaly on physical exam

EXAM:
THYROID ULTRASOUND
TECHNIQUE: Ultrasound examination of the thyroid gland and adjacent soft
tissues was performed.

[Series 1: us soft tissue head/neck · 0.08mm/px · 14 of 31 slices shown]
[im 1/31]
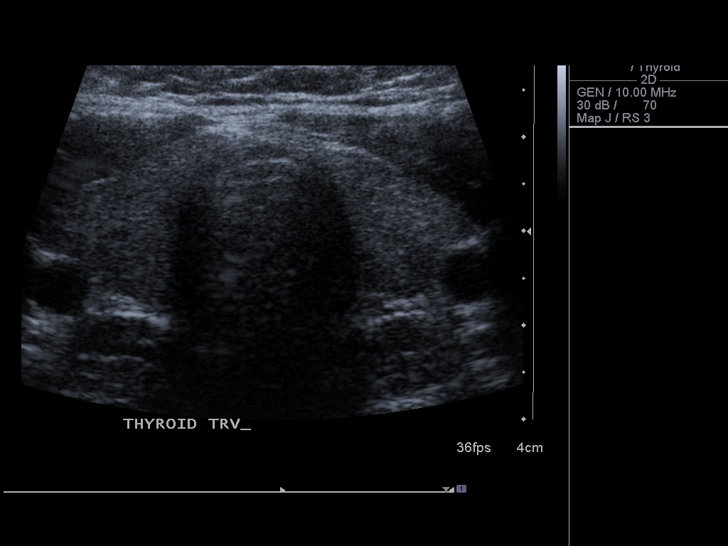
[im 3/31]
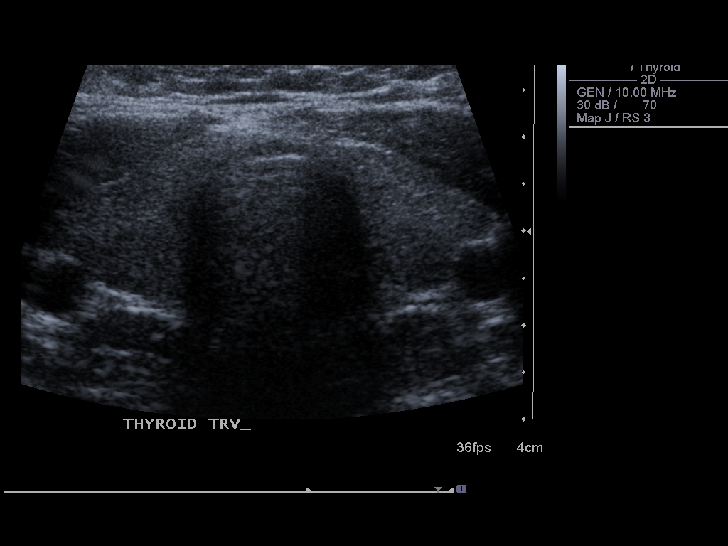
[im 6/31]
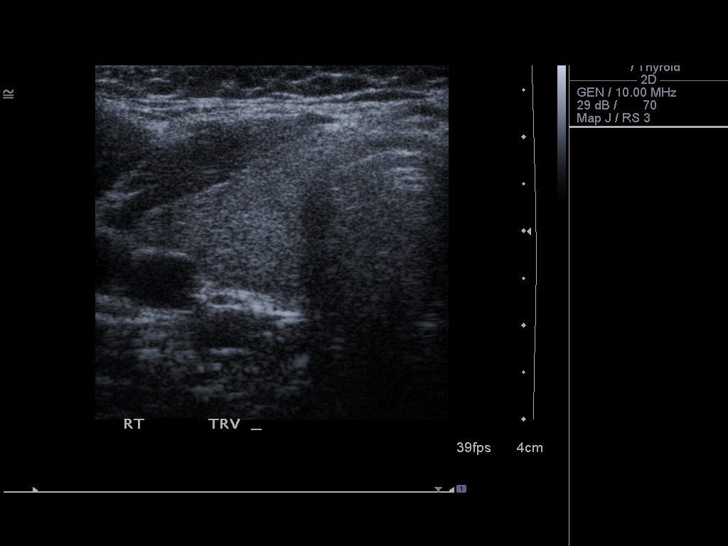
[im 8/31]
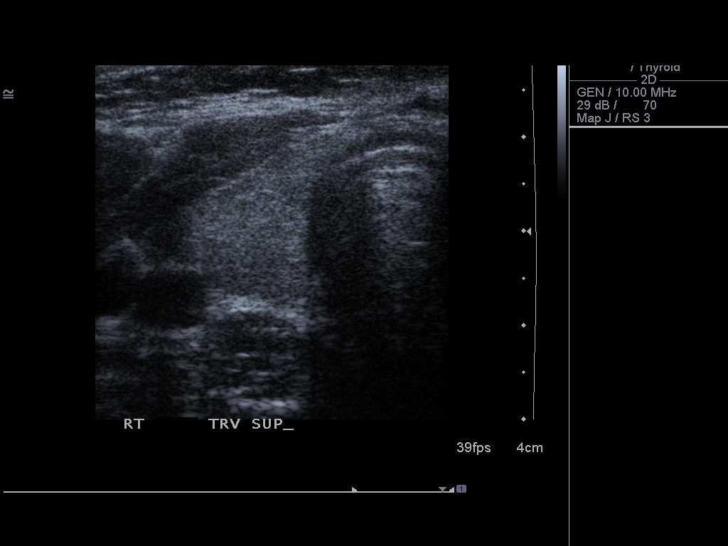
[im 11/31]
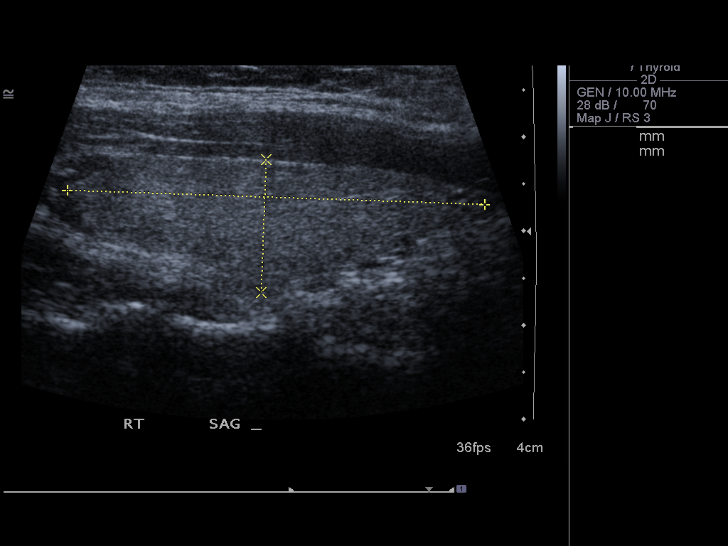
[im 12/31]
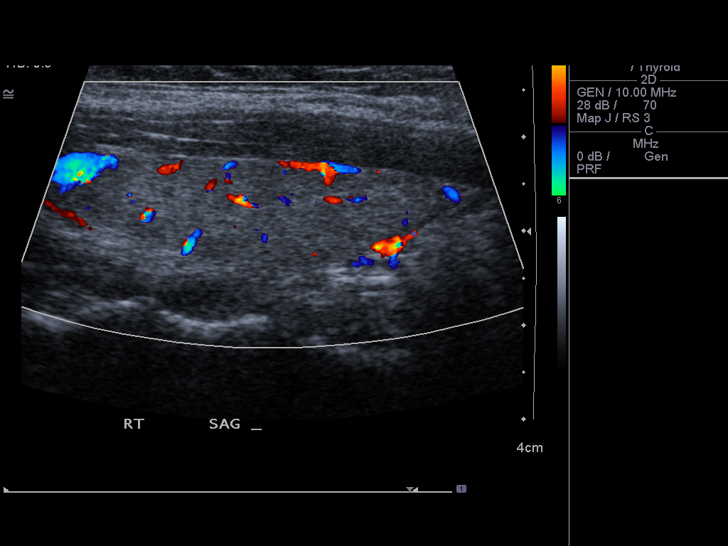
[im 14/31]
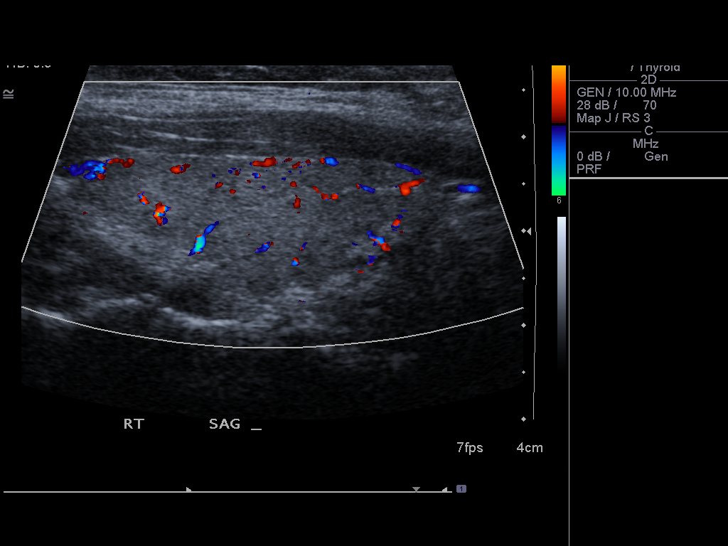
[im 17/31]
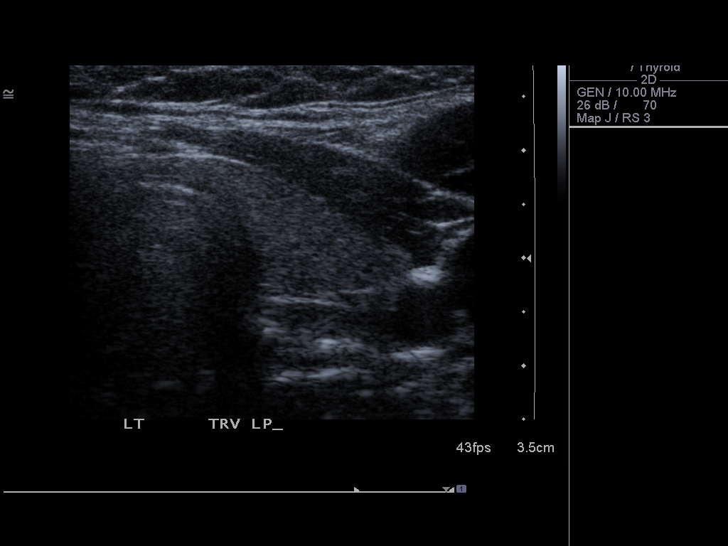
[im 19/31]
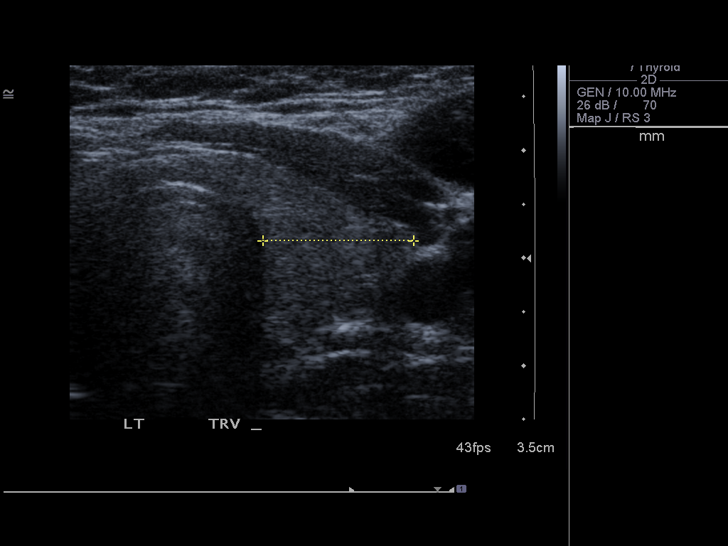
[im 21/31]
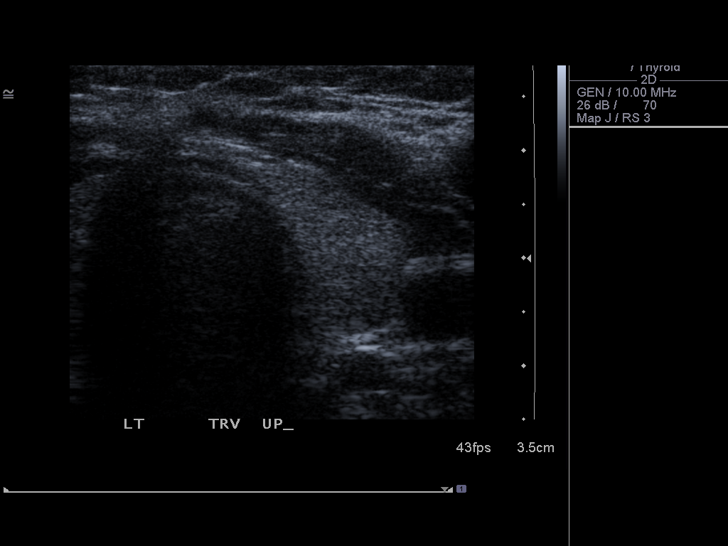
[im 23/31]
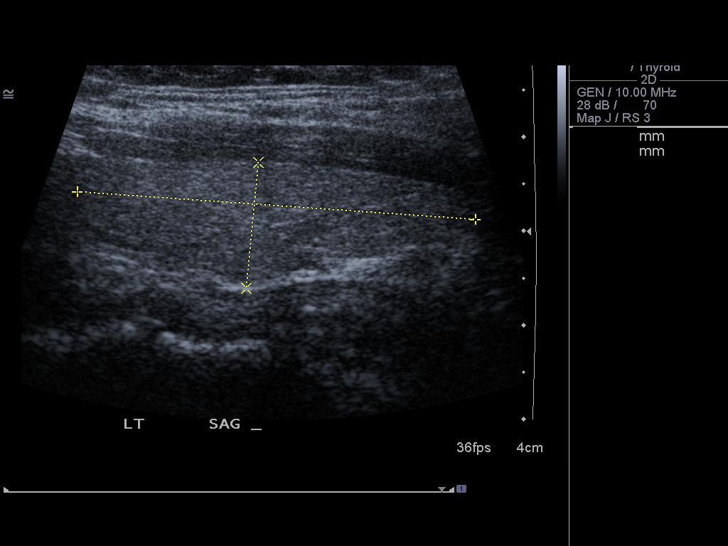
[im 26/31]
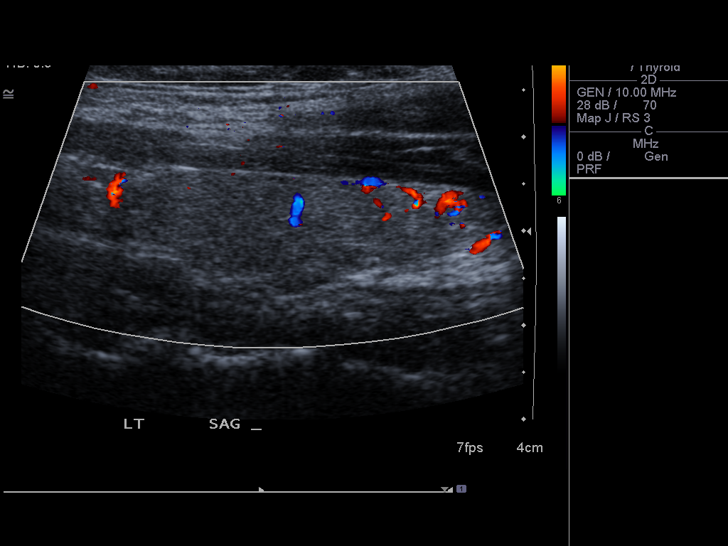
[im 28/31]
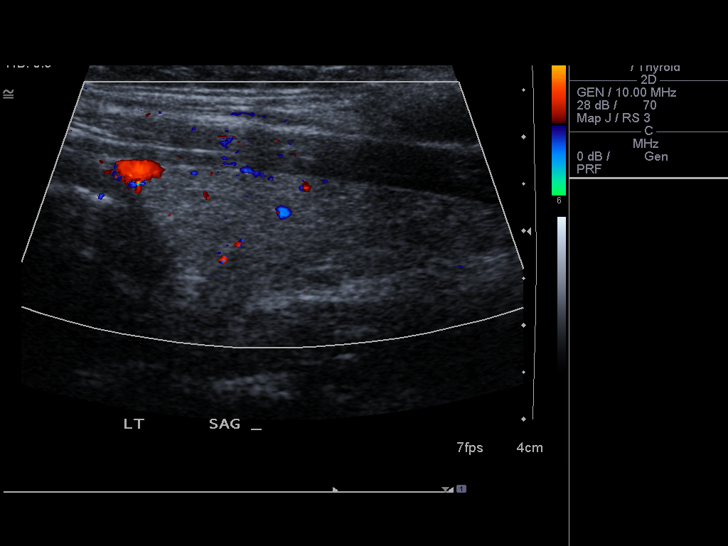
[im 31/31]
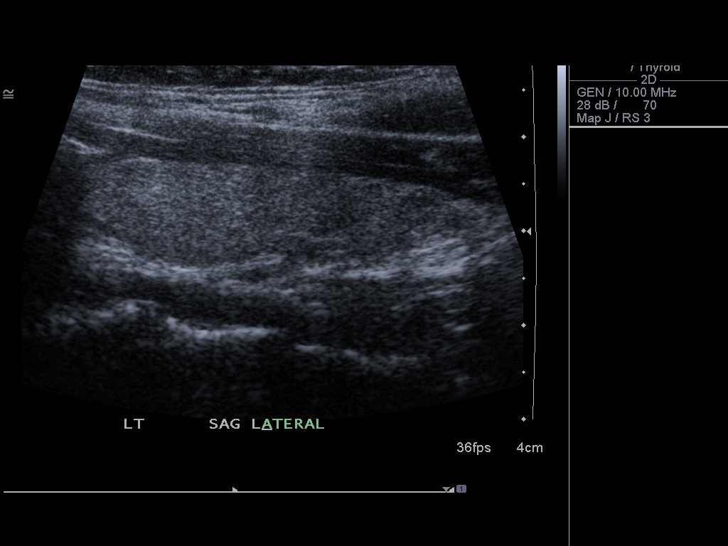

[14 of 25 positions shown; findings below may reference images not displayed]

FINDINGS: Right thyroid lobe

Measurements: 44 x 14 x 17 mm. No nodules visualized. Homogeneous
echotexture.

Left thyroid lobe

Measurements: 42 x 13 x 14 mm.  No nodules visualized.

Isthmus

Thickness: 2 mm.  No nodules visualized.

Lymphadenopathy

None visualized.
IMPRESSION: Normal-sized thyroid without focal lesion.

## 2015-08-30 ENCOUNTER — Encounter: Payer: Self-pay | Admitting: Family Medicine

## 2015-08-30 ENCOUNTER — Ambulatory Visit (INDEPENDENT_AMBULATORY_CARE_PROVIDER_SITE_OTHER): Payer: Managed Care, Other (non HMO) | Admitting: Family Medicine

## 2015-08-30 VITALS — BP 108/64 | HR 61 | Temp 98.6°F | Wt 212.6 lb

## 2015-08-30 DIAGNOSIS — F411 Generalized anxiety disorder: Secondary | ICD-10-CM

## 2015-08-30 MED ORDER — ALPRAZOLAM 0.25 MG PO TABS
0.2500 mg | ORAL_TABLET | Freq: Three times a day (TID) | ORAL | Status: DC | PRN
Start: 2015-08-30 — End: 2015-09-27

## 2015-08-30 NOTE — [facility] Notes (Signed)
Patient ID: Eren Puebla, female   DOB: 1986-02-02, 29 y.o.   MRN: [id number]   Subjective:    Patient ID: Molinda Bailiff, female    DOB: 1986-08-05, 29 y.o.   MRN: [id number]  Chief Complaint  Patient presents with  . Anxiety    would like to discuss medications    HPI Patient is in today for to discuss anxiety and occassional panic attacks.  She is not taking lexapro because she did not like the way it made her feel.  She wants something she can take prn.  Past Medical History  Diagnosis Date  . Asthma     childhood exercise induced  . Depression   . Toxic shock syndrome (TSS)     29 yrs old    Past Surgical History  Procedure Laterality Date  . Cesarean section  2011  . Tonsillectomy  2004    Family History  Problem Relation Age of Onset  . Hypertension Father   . Bipolar disorder Maternal Uncle   . Arthritis Maternal Grandmother   . Cancer Maternal Grandmother     ovarian  . Depression Maternal Grandmother   . Bipolar disorder Maternal Grandmother   . Arthritis Maternal Grandfather   . Heart disease Maternal Grandfather   . Stroke Maternal Grandfather   . Hypertension Maternal Grandfather   . Arthritis Paternal Grandmother   . Cancer Paternal Grandmother   . Arthritis Paternal Grandfather   . Stroke Paternal Grandfather   . Hypertension Paternal Grandfather   . Cancer Other     breast    Social History   Social History  . Marital Status: Single    Spouse Name: N/A  . Number of Children: 2  . Years of Education: N/A   Occupational History  . Not on file.   Social History Main Topics  . Smoking status: Current Every Day Smoker    Types: Cigarettes    Last Attempt to Quit: 06/23/2013  . Smokeless tobacco: Never Used     Comment: 1-10 cigarettes daily  . Alcohol Use: 0.6 - 2.4 oz/week    1-4 Standard drinks or equivalent per week  . Drug Use: No  . Sexual Activity: Not on file   Other Topics Concern  . Not on file   Social History Narrative   Caffeine use: 1 cup coffee daily   Regular exercise: no    Daughter born in 2011.   Works at [employer] degree   Lives with daughter and her boyfriend- they are in process of separating.              Outpatient Prescriptions Prior to Visit  Medication Sig Dispense Refill  . escitalopram (LEXAPRO) 20 MG tablet Take 1 tablet (20 mg total) by mouth daily. 90 tablet 0  . escitalopram (LEXAPRO) 5 MG tablet Take 1 tablet (5 mg total) by mouth daily. 14 tablet 0  . levonorgestrel (MIRENA) 20 MCG/24HR IUD 1 each by Intrauterine route once.     No facility-administered medications prior to visit.    Allergies  Allergen Reactions  . Amoxicillin Hives  . Azithromycin Other (See Comments)    dizziness  . Penicillins Hives  . Thera-Gesic Rash    Review of Systems  Constitutional: Negative for fever and malaise/fatigue.  HENT: Negative for congestion.   Eyes: Negative for discharge.  Respiratory: Negative for shortness of breath.   Cardiovascular: Negative for chest pain, palpitations and leg swelling.  Gastrointestinal:  Negative for nausea and abdominal pain.  Genitourinary: Negative for dysuria.  Musculoskeletal: Negative for falls.  Skin: Negative for rash.  Neurological: Negative for loss of consciousness and headaches.  Endo/Heme/Allergies: Negative for environmental allergies.  Psychiatric/Behavioral: Negative for depression. The patient is nervous/anxious.        Objective:    Physical Exam  Constitutional: She is oriented to person, place, and time. She appears well-developed and well-nourished.  HENT:  Head: Normocephalic and atraumatic.  Eyes: Conjunctivae and EOM are normal.  Neck: Normal range of motion. Neck supple. No JVD present. Carotid bruit is not present. No thyromegaly present.  Cardiovascular: Normal rate, regular rhythm and normal heart sounds.   No murmur heard. Pulmonary/Chest: Effort normal and breath sounds normal. No  respiratory distress. She has no wheezes. She has no rales. She exhibits no tenderness.  Musculoskeletal: She exhibits no edema.  Neurological: She is alert and oriented to person, place, and time.  Psychiatric: Her behavior is normal. Judgment and thought content normal. Her mood appears anxious. Cognition and memory are normal. She does not exhibit a depressed mood.    BP 108/64 mmHg  Pulse 61  Temp(Src) 98.6 F (37 C) (Oral)  Wt 212 lb 9.6 oz (96.435 kg)  SpO2 98%  LMP 08/21/2015 Wt Readings from Last 3 Encounters:  08/30/15 212 lb 9.6 oz (96.435 kg)  04/20/15 211 lb 6.4 oz (95.89 kg)  10/04/14 198 lb 3.2 oz (89.903 kg)     Lab Results  Component Value Date   WBC 10.5 09/16/2012   HGB 14.1 09/16/2012   HCT 41.8 09/16/2012   PLT 280 09/16/2012   GLUCOSE 118* 04/06/2010   ALT 19 04/06/2010   AST 19 04/06/2010   NA 135 04/06/2010   K 3.6 04/06/2010   CL 106 04/06/2010   CREATININE 0.51 04/06/2010   BUN 3* 04/06/2010   CO2 20 04/06/2010   TSH 0.57 10/04/2014    Lab Results  Component Value Date   TSH 0.57 10/04/2014   Lab Results  Component Value Date   WBC 10.5 09/16/2012   HGB 14.1 09/16/2012   HCT 41.8 09/16/2012   MCV 95.7 09/16/2012   PLT 280 09/16/2012   Lab Results  Component Value Date   NA 135 04/06/2010   K 3.6 04/06/2010   CO2 20 04/06/2010   GLUCOSE 118* 04/06/2010   BUN 3* 04/06/2010   CREATININE 0.51 04/06/2010   BILITOT 0.2* 04/06/2010   ALKPHOS 59 04/06/2010   AST 19 04/06/2010   ALT 19 04/06/2010   PROT 5.8* 04/06/2010   ALBUMIN 3.0* 04/06/2010   CALCIUM 8.6 04/06/2010   No results found for: CHOL No results found for: HDL No results found for: LDLCALC No results found for: TRIG No results found for: CHOLHDL No results found for: ZOXW9U     Assessment & Plan:   Problem List Items Addressed This Visit    None    Visit Diagnoses    Generalized anxiety disorder    -  Primary     con't counselor F/u pcp in 1 month or  sooner prn  I have discontinued Ms. Bachmeier's levonorgestrel, escitalopram, and escitalopram. I am also having her start on ALPRAZolam.  Meds ordered this encounter  Medications  . ALPRAZolam (XANAX) 0.25 MG tablet    Sig: Take 1 tablet (0.25 mg total) by mouth 3 (three) times daily as needed for anxiety.    Dispense:  30 tablet    Refill:  0  Garnet Koyanagi, DO

## 2015-08-30 NOTE — [facility] Notes (Signed)
Pre visit review using our clinic review tool, if applicable. No additional management support is needed unless otherwise documented below in the visit note. 

## 2015-08-30 NOTE — Patient Instructions (Signed)

## 2015-09-27 ENCOUNTER — Encounter: Payer: Self-pay | Admitting: Family

## 2015-09-27 ENCOUNTER — Ambulatory Visit (INDEPENDENT_AMBULATORY_CARE_PROVIDER_SITE_OTHER): Payer: Managed Care, Other (non HMO) | Admitting: Family

## 2015-09-27 VITALS — BP 120/66 | HR 60 | Temp 98.3°F | Resp 16 | Ht 66.0 in | Wt 216.8 lb

## 2015-09-27 DIAGNOSIS — F418 Other specified anxiety disorders: Secondary | ICD-10-CM | POA: Diagnosis not present

## 2015-09-27 DIAGNOSIS — Z23 Encounter for immunization: Secondary | ICD-10-CM | POA: Diagnosis not present

## 2015-09-27 DIAGNOSIS — F32A Depression, unspecified: Secondary | ICD-10-CM

## 2015-09-27 DIAGNOSIS — F419 Anxiety disorder, unspecified: Principal | ICD-10-CM

## 2015-09-27 DIAGNOSIS — F329 Major depressive disorder, single episode, unspecified: Secondary | ICD-10-CM

## 2015-09-27 MED ORDER — ALPRAZOLAM 0.25 MG PO TABS: 0.2500 mg | ORAL_TABLET | Freq: Three times a day (TID) | ORAL | Status: DC | PRN

## 2015-09-27 NOTE — Patient Instructions (Signed)
Please complete lab work prior to leaving (UDS). Follow up in 6 months, sooner if problems/concerns.

## 2015-09-27 NOTE — [facility] Notes (Signed)
Pre visit review using our clinic review tool, if applicable. No additional management support is needed unless otherwise documented below in the visit note. 

## 2015-09-27 NOTE — [facility] Notes (Signed)
Subjective:    Patient ID: Joanne [name], female    DOB: 08/26/1986, 29 y.o.   MRN: [id number]  HPI  Joanne [name] is a 29 yr old female who presents today for follow up of her anxiety. She reports that she is using 3-4 times a week.  Took 1/2 tab this AM.       Review of Systems See HPI  Past Medical History  Diagnosis Date  . Asthma     childhood exercise induced  . Depression   . Toxic shock syndrome (TSS) (HCC)     29 yrs old    Social History   Social History  . Marital Status: Single    Spouse Name: N/A  . Number of Children: 2  . Years of Education: N/A   Occupational History  . Not on file.   Social History Main Topics  . Smoking status: Current Some Day Smoker    Types: Cigarettes    Last Attempt to Quit: 06/23/2013  . Smokeless tobacco: Never Used     Comment: 10 cigarettes weekly  . Alcohol Use: 0.6 - 2.4 oz/week    1-4 Standard drinks or equivalent per week  . Drug Use: No  . Sexual Activity: Not on file   Other Topics Concern  . Not on file   Social History Narrative   Caffeine use: 1 cup coffee daily   Regular exercise: no    Daughter born in 2011.   Works at [employer] degree   Lives with daughter and her boyfriend- they are in process of separating.              Past Surgical History  Procedure Laterality Date  . Cesarean section  2011  . Tonsillectomy  2004    Family History  Problem Relation Age of Onset  . Hypertension Father   . Bipolar disorder Maternal Uncle   . Arthritis Maternal Grandmother   . Cancer Maternal Grandmother     ovarian  . Depression Maternal Grandmother   . Bipolar disorder Maternal Grandmother   . Arthritis Maternal Grandfather   . Heart disease Maternal Grandfather   . Stroke Maternal Grandfather   . Hypertension Maternal Grandfather   . Arthritis Paternal Grandmother   . Cancer Paternal Grandmother   . Arthritis Paternal Grandfather   . Stroke Paternal Grandfather   .  Hypertension Paternal Grandfather   . Cancer Other     breast    Allergies  Allergen Reactions  . Amoxicillin Hives  . Azithromycin Other (See Comments)    dizziness  . Penicillins Hives  . Thera-Gesic Rash    Current Outpatient Prescriptions on File Prior to Visit  Medication Sig Dispense Refill  . ALPRAZolam (XANAX) 0.25 MG tablet Take 1 tablet (0.25 mg total) by mouth 3 (three) times daily as needed for anxiety. 30 tablet 0   No current facility-administered medications on file prior to visit.    BP 120/66 mmHg  Pulse 60  Temp(Src) 98.3 F (36.8 C) (Oral)  Resp 16  Ht  (1.676 m)  Wt 216 lb 12.8 oz (98.34 kg)  BMI 35.01 kg/m2  LMP 09/23/2015       Objective:   Physical Exam  Constitutional: She is oriented to person, place, and time. She appears well-developed and well-nourished.  HENT:  Head: Normocephalic and atraumatic.  Cardiovascular: Normal rate, regular rhythm and normal heart sounds.   No murmur heard. Pulmonary/Chest: Effort normal and  breath sounds normal. No respiratory distress. She has no wheezes.  Musculoskeletal: She exhibits no edema.  Neurological: She is alert and oriented to person, place, and time.  Psychiatric: She has a normal mood and affect. Her behavior is normal. Judgment and thought content normal.          Assessment & Plan:  Flu shot today.

## 2015-09-27 NOTE — Assessment & Plan Note (Signed)
Currently stable with use of prn xanax. Refill is provided. We discussed that she should not become pregnant while taking this medication. She verbalizes understanding. A controlled substance contract is signed today and we will send the patient for a UDS.

## 2015-11-11 ENCOUNTER — Other Ambulatory Visit: Payer: Self-pay | Admitting: Family

## 2015-11-11 MED ORDER — ALPRAZOLAM 0.25 MG PO TABS: 0.2500 mg | ORAL_TABLET | Freq: Three times a day (TID) | ORAL | Status: DC | PRN

## 2015-11-11 NOTE — Telephone Encounter (Signed)
Rx printed and forwarded to PCP for signature. 

## 2016-02-08 ENCOUNTER — Encounter: Payer: Self-pay | Admitting: Family

## 2016-02-08 ENCOUNTER — Ambulatory Visit (INDEPENDENT_AMBULATORY_CARE_PROVIDER_SITE_OTHER): Payer: Self-pay | Admitting: Family

## 2016-02-08 VITALS — BP 133/82 | HR 88 | Temp 98.5°F | Resp 16 | Ht 66.0 in | Wt 209.6 lb

## 2016-02-08 DIAGNOSIS — F418 Other specified anxiety disorders: Secondary | ICD-10-CM

## 2016-02-08 MED ORDER — VENLAFAXINE HCL ER 75 MG PO CP24: 75.0000 mg | ORAL_CAPSULE | Freq: Every day | ORAL | Status: DC

## 2016-02-08 NOTE — Progress Notes (Signed)
Subjective:    Patient ID: Joanne [name], female    DOB: 1986-09-11, 30 y.o.   MRN: [id number]  HPI  Joanne [name] is a 30 yr old female who presents today to discuss anxiety and depreesion.  She reports that her anxiety and depression have worsened over the last 2 months.  Finds herself tearful and unmotivated.  Has been on wellbutrin and lexapro in the past however ultimately stopped due to not wanting to be on medications.    Review of Systems See HPI  Past Medical History  Diagnosis Date  . Asthma     childhood exercise induced  . Depression   . Toxic shock syndrome (TSS) (HCC)     30 yrs old    Social History   Social History  . Marital Status: Single    Spouse Name: N/A  . Number of Children: 2  . Years of Education: N/A   Occupational History  . Not on file.   Social History Main Topics  . Smoking status: Current Some Day Smoker    Types: Cigarettes    Last Attempt to Quit: 06/23/2013  . Smokeless tobacco: Never Used     Comment: 10 cigarettes weekly  . Alcohol Use: 0.6 - 2.4 oz/week    1-4 Standard drinks or equivalent per week  . Drug Use: No  . Sexual Activity: Not on file   Other Topics Concern  . Not on file   Social History Narrative   Caffeine use: 1 cup coffee daily   Regular exercise: no    Daughter born in 2011.   Works at [employer] degree   Lives with daughter and her boyfriend- they are in process of separating.              Past Surgical History  Procedure Laterality Date  . Cesarean section  2011  . Tonsillectomy  2004    Family History  Problem Relation Age of Onset  . Hypertension Father   . Bipolar disorder Maternal Uncle   . Arthritis Maternal Grandmother   . Cancer Maternal Grandmother     ovarian  . Depression Maternal Grandmother   . Bipolar disorder Maternal Grandmother   . Arthritis Maternal Grandfather   . Heart disease Maternal Grandfather   . Stroke Maternal Grandfather   .  Hypertension Maternal Grandfather   . Arthritis Paternal Grandmother   . Cancer Paternal Grandmother   . Arthritis Paternal Grandfather   . Stroke Paternal Grandfather   . Hypertension Paternal Grandfather   . Cancer Other     breast    Allergies  Allergen Reactions  . Amoxicillin Hives  . Azithromycin Other (See Comments)    dizziness  . Penicillins Hives  . Thera-Gesic Rash    Current Outpatient Prescriptions on File Prior to Visit  Medication Sig Dispense Refill  . ALPRAZolam (XANAX) 0.25 MG tablet Take 1 tablet (0.25 mg total) by mouth 3 (three) times daily as needed for anxiety. (Patient not taking: Reported on 02/08/2016) 30 tablet 0   No current facility-administered medications on file prior to visit.    BP 133/82 mmHg  Pulse 88  Temp(Src) 98.5 F (36.9 C) (Oral)  Resp 16  Ht  (1.676 m)  Wt 209 lb 9.6 oz (95.074 kg)  BMI 33.85 kg/m2  SpO2 99%  LMP 02/01/2016       Objective:   Physical Exam  Constitutional: She is oriented to person, place, and time.  She appears well-developed and well-nourished. No distress.  Musculoskeletal: She exhibits no edema.  Neurological: She is alert and oriented to person, place, and time.  Psychiatric: Her behavior is normal. Judgment and thought content normal.  tearful          Assessment & Plan:

## 2016-02-08 NOTE — Patient Instructions (Signed)
Start effexor once daily to help with your anxiety/depression.

## 2016-02-08 NOTE — [facility] Notes (Signed)
Pre visit review using our clinic review tool, if applicable. No additional management support is needed unless otherwise documented below in the visit note. 

## 2016-02-09 NOTE — Assessment & Plan Note (Signed)
Deteriorated.  At this point, I think she could benefit from restarting medication.  Will give trial of effexor (she wishes to avoid meds with potential for weight gain).  She is advised to go to the ER if she develops SI/HI.  She verbalizes understanding.  Follow up in 1 month.

## 2016-02-14 ENCOUNTER — Other Ambulatory Visit: Payer: Self-pay | Admitting: *Deleted

## 2016-02-14 MED ORDER — ESCITALOPRAM OXALATE 10 MG PO TABS: ORAL_TABLET | ORAL | Status: AC

## 2016-02-14 NOTE — Telephone Encounter (Signed)
What side effects was she having?  OK to d/c effexor, Instead start lexapro- she has tolerated this in past, rx pended.

## 2016-02-14 NOTE — Telephone Encounter (Signed)
Received fax from pharmacy stating, "patient took it [Venlafaxine] for 1 day and the side effects were too much for patient. She is requesting something else in place of it"/SLS 02/21 Please Advise.

## 2016-02-14 NOTE — Telephone Encounter (Signed)
Attempted to reach pt and left message to check mychart acct. Message sent to pt via mychart. Lexapro sent.

## 2016-02-16 ENCOUNTER — Telehealth: Payer: Self-pay | Admitting: Family

## 2016-02-16 NOTE — Telephone Encounter (Signed)
Pt is requesting a refill on his ALPRAZolam Rx.   Pharmacy - [facility] on [address] [place]    CB: [phone]

## 2016-02-17 MED ORDER — ALPRAZOLAM 0.25 MG PO TABS: 0.2500 mg | ORAL_TABLET | Freq: Three times a day (TID) | ORAL | Status: AC | PRN

## 2016-02-17 NOTE — Telephone Encounter (Signed)
Rx printed.  Awaiting signature.   

## 2016-02-17 NOTE — Telephone Encounter (Signed)
OK to send 30 tabs zero refills.  

## 2016-02-17 NOTE — Addendum Note (Signed)
Addended by: Tylene Fantasia on: 02/17/2016 02:35 PM   Modules accepted: Orders

## 2016-02-17 NOTE — Telephone Encounter (Signed)
Patient requesting refill for alprazolam rx.  #30 with 0 rf Last OV 02/07/15 Last filled 11/11/15 #30 with 0 UDS 09/27/15 Moderate Risk

## 2016-02-20 NOTE — Telephone Encounter (Signed)
Rx faxed to pharmacy  

## 2016-03-07 ENCOUNTER — Ambulatory Visit: Payer: BLUE CROSS/BLUE SHIELD | Admitting: Family

## 2016-03-27 ENCOUNTER — Ambulatory Visit: Payer: Managed Care, Other (non HMO) | Admitting: Family
